# Patient Record
Sex: Male | Born: 1965 | Race: Black or African American | Hispanic: No | Marital: Single | Smoking: Current every day smoker
Health system: Southern US, Community
[De-identification: ages and names within clinical notes are randomized; demographics above are authoritative.]

## PROBLEM LIST (undated history)

## (undated) DIAGNOSIS — Z7289 Other problems related to lifestyle: Secondary | ICD-10-CM

---

## 1999-05-10 ENCOUNTER — Emergency Department (HOSPITAL_COMMUNITY): Admission: EM | Admit: 1999-05-10 | Discharge: 1999-05-11 | Payer: Self-pay | Admitting: Emergency Medicine

## 2001-11-07 ENCOUNTER — Inpatient Hospital Stay (HOSPITAL_COMMUNITY): Admission: EM | Admit: 2001-11-07 | Discharge: 2001-11-11 | Payer: Self-pay | Admitting: Psychiatry

## 2001-11-07 ENCOUNTER — Emergency Department (HOSPITAL_COMMUNITY): Admission: EM | Admit: 2001-11-07 | Discharge: 2001-11-07 | Payer: Self-pay | Admitting: Emergency Medicine

## 2018-05-21 ENCOUNTER — Emergency Department (HOSPITAL_COMMUNITY): Payer: Self-pay

## 2018-05-21 ENCOUNTER — Inpatient Hospital Stay (HOSPITAL_COMMUNITY)
Admission: EM | Admit: 2018-05-21 | Discharge: 2018-05-31 | DRG: 163 | Disposition: A | Discharge status: Home / Self Care | Payer: Self-pay | Attending: Cardiothoracic Surgery | Admitting: Cardiothoracic Surgery

## 2018-05-21 ENCOUNTER — Other Ambulatory Visit: Payer: Self-pay

## 2018-05-21 ENCOUNTER — Encounter (HOSPITAL_COMMUNITY): Payer: Self-pay | Admitting: Emergency Medicine

## 2018-05-21 DIAGNOSIS — Z9889 Other specified postprocedural states: Secondary | ICD-10-CM

## 2018-05-21 DIAGNOSIS — I10 Essential (primary) hypertension: Secondary | ICD-10-CM | POA: Diagnosis present

## 2018-05-21 DIAGNOSIS — E041 Nontoxic single thyroid nodule: Secondary | ICD-10-CM | POA: Diagnosis present

## 2018-05-21 DIAGNOSIS — J90 Pleural effusion, not elsewhere classified: Principal | ICD-10-CM

## 2018-05-21 DIAGNOSIS — J869 Pyothorax without fistula: Secondary | ICD-10-CM | POA: Diagnosis present

## 2018-05-21 DIAGNOSIS — Z720 Tobacco use: Secondary | ICD-10-CM | POA: Diagnosis present

## 2018-05-21 DIAGNOSIS — Z4682 Encounter for fitting and adjustment of non-vascular catheter: Secondary | ICD-10-CM

## 2018-05-21 DIAGNOSIS — F17210 Nicotine dependence, cigarettes, uncomplicated: Secondary | ICD-10-CM | POA: Diagnosis present

## 2018-05-21 DIAGNOSIS — J181 Lobar pneumonia, unspecified organism: Secondary | ICD-10-CM | POA: Diagnosis present

## 2018-05-21 LAB — BASIC METABOLIC PANEL
Anion gap: 9 (ref 5–15)
BUN: 11 mg/dL (ref 6–20)
CO2: 30 mmol/L (ref 22–32)
CREATININE: 0.78 mg/dL (ref 0.61–1.24)
Calcium: 9.5 mg/dL (ref 8.9–10.3)
Chloride: 99 mmol/L (ref 98–111)
GFR calc Af Amer: >60 mL/min (ref >60)
Glucose, Bld: 127 mg/dL — ABNORMAL HIGH (ref 70–99)
POTASSIUM: 4.4 mmol/L (ref 3.5–5.1)
Sodium: 138 mmol/L (ref 135–145)

## 2018-05-21 LAB — CBC
HEMATOCRIT: 47.9 % (ref 39.0–52.0)
Hemoglobin: 15.1 g/dL (ref 13.0–17.0)
MCH: 30.8 pg (ref 26.0–34.0)
MCHC: 31.5 g/dL (ref 30.0–36.0)
MCV: 97.6 fL (ref 78.0–100.0)
Platelets: 257 10*3/uL (ref 150–400)
RBC: 4.91 MIL/uL (ref 4.22–5.81)
RDW: 13.4 % (ref 11.5–15.5)
WBC: 9.3 10*3/uL (ref 4.0–10.5)

## 2018-05-21 LAB — I-STAT TROPONIN, ED
Troponin i, poc: 0 ng/mL (ref 0.00–0.08)
Troponin i, poc: 0 ng/mL (ref 0.00–0.08)

## 2018-05-21 MED ORDER — IOHEXOL 300 MG/ML  SOLN
100.0000 mL | Freq: Once | INTRAMUSCULAR | Status: AC | PRN
  Administered 2018-05-21: 100 mL via INTRAVENOUS

## 2018-05-21 MED ORDER — FENTANYL CITRATE (PF) 100 MCG/2ML IJ SOLN
50.0000 ug | Freq: Once | INTRAMUSCULAR | Status: AC
  Administered 2018-05-21: 50 ug via INTRAVENOUS
  Filled 2018-05-21: qty 2

## 2018-05-21 NOTE — ED Triage Notes (Signed)
Pt reports "side pain" but later elaborated that the pain has moved to his chest and in the back.  Pt denies n/v/d, weakness but indorses SOB.

## 2018-05-21 NOTE — ED Provider Notes (Signed)
Patient signed out to me at shift change.  Plan for admission for new left pleural effusion.  No fever, non-productive cough, no leukocytosis.    Appreciate Dr. Toniann FailKakrakandy for admitting.   Roxy HorsemanBrowning, Asier Desroches, PA-C 05/21/18 2245    Rolan BuccoBelfi, Melanie, MD 05/22/18 1153

## 2018-05-21 NOTE — ED Notes (Signed)
Pt stepped outside to smoke

## 2018-05-21 NOTE — ED Provider Notes (Signed)
[facility] Provider Note   CSN: [id number] Arrival date & time: 05/21/18  1458     History   Chief Complaint Chief Complaint  Patient presents with  . Chest Pain    HPI Amdrew Clancy GourdM Ozier is a 52 y.o. male.  Patient is a 52 year old male with no significant past medical history who presents with left-sided chest pain.  He states is been uncomfortable for about a week.  It hurts mostly when he takes a deep breath.  His left side and now goes around to his back and he also has some occasional tightness in the left anterior chest.  He denies any exertional symptoms.  He states he occasionally has some shortness of breath.  No leg pain or swelling.  He has what feels like a little tickle in his throat and he has been coughing a bit more due to that.  The cough is mostly nonproductive.  He denies any known fevers.  No history of lung problems in the past.  He does smoke cigarettes.     History reviewed. No pertinent past medical history.  There are no active problems to display for this patient.   History reviewed. No pertinent surgical history.      Home Medications    Prior to Admission medications   Not on File    Family History No family history on file.  Social History Social History   Tobacco Use  . Smoking status: Not on file  Substance Use Topics  . Alcohol use: Not on file  . Drug use: Not on file     Allergies   Patient has no known allergies.   Review of Systems Review of Systems  Constitutional: Positive for fatigue. Negative for chills, diaphoresis and fever.  HENT: Negative for congestion, rhinorrhea and sneezing.   Eyes: Negative.   Respiratory: Positive for shortness of breath. Negative for cough and chest tightness.   Cardiovascular: Positive for chest pain. Negative for leg swelling.  Gastrointestinal: Negative for abdominal pain, blood in stool, diarrhea, nausea and vomiting.  Genitourinary: Negative  for difficulty urinating, flank pain, frequency and hematuria.  Musculoskeletal: Negative for arthralgias and back pain.  Skin: Negative for rash.  Neurological: Negative for dizziness, speech difficulty, weakness, numbness and headaches.     Physical Exam Updated Vital Signs BP (!) 153/100   Pulse 84   Temp 98.8 F (37.1 C) (Oral)   Resp 19   Ht 5\' 7"  (1.702 m)   Wt 83.9 kg (185 lb)   SpO2 95%   BMI 28.98 kg/m   Physical Exam  Constitutional: He is oriented to person, place, and time. He appears well-developed and well-nourished.  HENT:  Head: Normocephalic and atraumatic.  Eyes: Pupils are equal, round, and reactive to light.  Neck: Normal range of motion. Neck supple.  Cardiovascular: Normal rate, regular rhythm and normal heart sounds.  Pulmonary/Chest: Effort normal and breath sounds normal. No respiratory distress. He has no wheezes. He has no rales. He exhibits no tenderness.  Abdominal: Soft. Bowel sounds are normal. There is no tenderness. There is no rebound and no guarding.  Musculoskeletal: Normal range of motion. He exhibits no edema.  No edema or calf tenderness  Lymphadenopathy:    He has no cervical adenopathy.  Neurological: He is alert and oriented to person, place, and time.  Skin: Skin is warm and dry. No rash noted.  Psychiatric: He has a normal mood and affect.     ED  Treatments / Results  Labs (all labs ordered are listed, but only abnormal results are displayed) Labs Reviewed  BASIC METABOLIC PANEL - Abnormal; Notable for the following components:      Result Value   Glucose, Bld 127 (*)    All other components within normal limits  CBC  I-STAT TROPONIN, ED  I-STAT TROPONIN, ED    EKG EKG Interpretation  Date/Time:  Saturday May 21 2018 15:17:09 EDT Ventricular Rate:  114 PR Interval:  134 QRS Duration: 84 QT Interval:  314 QTC Calculation: 432 R Axis:   50 Text Interpretation:  Sinus tachycardia Left ventricular hypertrophy  Nonspecific T wave abnormality Abnormal ECG No old tracing to compare Confirmed by Rolan Bucco [id number]) on 05/21/2018 8:11:03 PM   Radiology Dg Chest 2 View  Result Date: 05/21/2018 CLINICAL DATA:  Left side chest pain EXAM: CHEST - 2 VIEW COMPARISON:  None. FINDINGS: Large left pleural effusion with left base atelectasis. No confluent opacity on the right. Heart is mildly enlarged. No acute bony abnormality. IMPRESSION: Large left pleural effusion with left base atelectasis. Electronically Signed   By: Charlett Nose M.D.   On: 05/21/2018 16:07   Ct Chest W Contrast  Result Date: 05/21/2018 CLINICAL DATA:  Pleural effusion.  Chest pain. EXAM: CT CHEST WITH CONTRAST TECHNIQUE: Multidetector CT imaging of the chest was performed during intravenous contrast administration. CONTRAST:  OMNIPAQUE IOHEXOL 300 MG/ML  SOLN COMPARISON:  Chest x-ray earlier today FINDINGS: Cardiovascular: Heart is normal size.  Aorta is normal caliber. Mediastinum/Nodes: Scattered borderline and small mediastinal lymph nodes, none pathologically enlarged. No mediastinal, hilar, or axillary adenopathy. Lungs/Pleura: Moderate to large left pleural effusion. Compressive atelectasis in the left lower lobe and lingula. Minimal right base atelectasis. Upper Abdomen: Imaging into the upper abdomen shows no acute findings. Musculoskeletal: Chest wall soft tissues are unremarkable. No acute bony abnormality. IMPRESSION: Moderate to large left pleural effusion with left lower lobe and lingular atelectasis. 3.2 cm left thyroid nodule. This could be further characterized with elective thyroid ultrasound. Electronically Signed   By: Charlett Nose M.D.   On: 05/21/2018 21:22    Procedures Procedures (including critical care time)  Medications Ordered in ED Medications  fentaNYL (SUBLIMAZE) injection 50 mcg (50 mcg Intravenous Given 05/21/18 2037)  iohexol (OMNIPAQUE) 300 MG/ML solution 100 mL (100 mLs Intravenous Contrast Given  05/21/18 2054)     Initial Impression / Assessment and Plan / ED Course  I have reviewed the triage vital signs and the nursing notes.  Pertinent labs & imaging results that were available during my care of the patient were reviewed by me and considered in my medical decision making (see chart for details).     Patient is a 52 year old male who presents with left side chest pain.  To little worse when he takes a deep breath.  He has some mild associated shortness of breath, primarily on ambulation.  He is not hypoxic at rest.  His chest x-ray showed a moderate to large sized pleural effusion.  CT scan with contrast shows no underlying mass or consolidation but there is a moderate sized effusion.  I feel he needs to be admitted for further evaluation of this pleural effusion.  Ivar Drape to discuss with hospitalist.  Pt[facility] at [facility].  Final Clinical Impressions(s) / ED Diagnoses   Final diagnoses:  Pleural effusion    ED Discharge Orders    None       Rolan Bucco, MD 05/21/18 2221

## 2018-05-21 NOTE — ED Notes (Signed)
Called in wt room with no answer 

## 2018-05-21 NOTE — ED Notes (Signed)
Patient transported to CT 

## 2018-05-22 ENCOUNTER — Observation Stay (HOSPITAL_COMMUNITY): Payer: Self-pay

## 2018-05-22 ENCOUNTER — Encounter (HOSPITAL_COMMUNITY): Payer: Self-pay | Admitting: Internal Medicine

## 2018-05-22 ENCOUNTER — Other Ambulatory Visit (HOSPITAL_COMMUNITY): Payer: Self-pay

## 2018-05-22 DIAGNOSIS — Z720 Tobacco use: Secondary | ICD-10-CM | POA: Diagnosis present

## 2018-05-22 LAB — HEPATIC FUNCTION PANEL
ALT: 22 U/L (ref 0–44)
AST: 20 U/L (ref 15–41)
Albumin: 3.1 g/dL — ABNORMAL LOW (ref 3.5–5.0)
Alkaline Phosphatase: 46 U/L (ref 38–126)
BILIRUBIN INDIRECT: 0.5 mg/dL (ref 0.3–0.9)
Bilirubin, Direct: 0.2 mg/dL (ref 0.0–0.2)
TOTAL PROTEIN: 6.7 g/dL (ref 6.5–8.1)
Total Bilirubin: 0.7 mg/dL (ref 0.3–1.2)

## 2018-05-22 LAB — BODY FLUID CELL COUNT WITH DIFFERENTIAL
Eos, Fluid: 0 %
Lymphs, Fluid: 5 %
Monocyte-Macrophage-Serous Fluid: 14 % — ABNORMAL LOW (ref 50–90)
Neutrophil Count, Fluid: 81 % — ABNORMAL HIGH (ref 0–25)
Total Nucleated Cell Count, Fluid: 10000 cu mm — ABNORMAL HIGH (ref 0–1000)

## 2018-05-22 LAB — GRAM STAIN

## 2018-05-22 LAB — ALBUMIN, PLEURAL OR PERITONEAL FLUID: Albumin, Fluid: 2.7 g/dL

## 2018-05-22 LAB — LACTATE DEHYDROGENASE: LDH: 122 U/L (ref 98–192)

## 2018-05-22 LAB — BASIC METABOLIC PANEL
Anion gap: 11 (ref 5–15)
BUN: 7 mg/dL (ref 6–20)
CO2: 27 mmol/L (ref 22–32)
Calcium: 9 mg/dL (ref 8.9–10.3)
Chloride: 98 mmol/L (ref 98–111)
Creatinine, Ser: 0.64 mg/dL (ref 0.61–1.24)
GFR calc Af Amer: >60 mL/min (ref >60)
GFR calc non Af Amer: >60 mL/min (ref >60)
Glucose, Bld: 139 mg/dL — ABNORMAL HIGH (ref 70–99)
Potassium: 3.9 mmol/L (ref 3.5–5.1)
SODIUM: 136 mmol/L (ref 135–145)

## 2018-05-22 LAB — CBC
HCT: 42.9 % (ref 39.0–52.0)
HEMOGLOBIN: 13.9 g/dL (ref 13.0–17.0)
MCH: 30.9 pg (ref 26.0–34.0)
MCHC: 32.4 g/dL (ref 30.0–36.0)
MCV: 95.3 fL (ref 78.0–100.0)
Platelets: 219 10*3/uL (ref 150–400)
RBC: 4.5 MIL/uL (ref 4.22–5.81)
RDW: 13.4 % (ref 11.5–15.5)
WBC: 8.9 10*3/uL (ref 4.0–10.5)

## 2018-05-22 LAB — TROPONIN I
Troponin I: <0.03 ng/mL (ref <0.03)
Troponin I: <0.03 ng/mL (ref <0.03)

## 2018-05-22 LAB — AMYLASE, PLEURAL OR PERITONEAL FLUID: Amylase, Fluid: 77 U/L

## 2018-05-22 LAB — PROTEIN, PLEURAL OR PERITONEAL FLUID: Total protein, fluid: 5.1 g/dL

## 2018-05-22 LAB — LACTATE DEHYDROGENASE, PLEURAL OR PERITONEAL FLUID: LD FL: 524 U/L — AB (ref 3–23)

## 2018-05-22 LAB — PROCALCITONIN

## 2018-05-22 LAB — TSH: TSH: 0.946 u[IU]/mL (ref 0.350–4.500)

## 2018-05-22 LAB — HIV ANTIBODY (ROUTINE TESTING W REFLEX): HIV Screen 4th Generation wRfx: NONREACTIVE

## 2018-05-22 LAB — GLUCOSE, PLEURAL OR PERITONEAL FLUID: GLUCOSE FL: 74 mg/dL

## 2018-05-22 MED ORDER — LIDOCAINE HCL (PF) 1 % IJ SOLN
INTRAMUSCULAR | Status: AC
  Filled 2018-05-22: qty 30

## 2018-05-22 MED ORDER — ONDANSETRON HCL 4 MG PO TABS: 4.0000 mg | ORAL_TABLET | Freq: Four times a day (QID) | ORAL | Status: DC | PRN

## 2018-05-22 MED ORDER — LEVOFLOXACIN 750 MG PO TABS
750.0000 mg | ORAL_TABLET | Freq: Every day | ORAL | Status: DC
  Administered 2018-05-22 – 2018-05-25 (×4): 750 mg via ORAL
  Filled 2018-05-22 (×4): qty 1

## 2018-05-22 MED ORDER — OXYCODONE HCL 5 MG PO TABS
5.0000 mg | ORAL_TABLET | ORAL | Status: DC | PRN
  Administered 2018-05-22 (×2): 5 mg via ORAL
  Filled 2018-05-22 (×2): qty 1

## 2018-05-22 MED ORDER — ONDANSETRON HCL 4 MG/2ML IJ SOLN: 4.0000 mg | Freq: Four times a day (QID) | INTRAMUSCULAR | Status: DC | PRN

## 2018-05-22 MED ORDER — ACETAMINOPHEN 325 MG PO TABS
650.0000 mg | ORAL_TABLET | Freq: Four times a day (QID) | ORAL | Status: DC | PRN
  Administered 2018-05-22 – 2018-05-26 (×7): 650 mg via ORAL
  Filled 2018-05-22 (×7): qty 2

## 2018-05-22 MED ORDER — ACETAMINOPHEN 650 MG RE SUPP: 650.0000 mg | Freq: Four times a day (QID) | RECTAL | Status: DC | PRN

## 2018-05-22 NOTE — Procedures (Signed)
PROCEDURE SUMMARY:  Successful US guided left thoracentesis. Yielded 580 mL of tea colored fluid. Patient tolerated procedure well. No immediate complications.  Specimen was sent for labs.  Post procedure chest X-ray reveals no pneumothorax  Matthewjames Petrasek S Emine Lopata PA-C 05/22/2018 12:13 PM

## 2018-05-22 NOTE — H&P (Addendum)
History and Physical    John Rodriguez WUJ:[id number] DOB: [birth year] DOA: 05/21/2018  PCP: Lavinia Sharps, NP  Patient coming from: Home.  Chief Complaint: Chest pain.  HPI: John Rodriguez is a 52 y.o. male with history of tobacco abuse presents to the ER with complaint of chest pain.  Patient states over the last 1 week he has been having chest pain which started on the left side of the anterior chest wall which gradually moved across the chest and is present all over the chest at this time.  Has no specific exertional symptoms or any aggravating or relieving factors.  Denies any productive cough fever or chills denies any recent travel or sick contacts.  Denies any loss of weight.  ED Course: In the ER EKG shows sinus tachycardia with LVH and troponin was negative.  CT chest with contrast shows large left pleural effusion.  Patient admitted for thoracentesis and symptomatic management and further evaluation of the left pleural effusion.  Review of Systems: As per HPI, rest all negative.   History reviewed. No pertinent past medical history.  History reviewed. No pertinent surgical history.   reports that he has been smoking.  He has never used smokeless tobacco. He reports that he drinks alcohol. His drug history is not on file.  No Known Allergies  Family History  Problem Relation Age of Onset  . Prostate cancer Father     Prior to Admission medications   Medication Sig Start Date End Date Taking? Authorizing Provider  naproxen sodium (ALEVE) 220 MG tablet Take 220 mg by mouth 2 (two) times daily as needed (for pain).   Yes [provider]    Physical Exam: Vitals:   05/21/18 2015 05/21/18 2115 05/21/18 2130 05/22/18 0000  BP: (!) 158/94 (!) 152/98 (!) 153/100 (!) 133/93  Pulse: 91 88 84 98  Resp: (!) 21 18 19    Temp:      TempSrc:      SpO2: 95% 93% 95% 95%  Weight:      Height:          Constitutional: Moderately built and nourished. Vitals:   05/21/18 2015 05/21/18 2115 05/21/18 2130 05/22/18 0000  BP: (!) 158/94 (!) 152/98 (!) 153/100 (!) 133/93  Pulse: 91 88 84 98  Resp: (!) 21 18 19    Temp:      TempSrc:      SpO2: 95% 93% 95% 95%  Weight:      Height:       Eyes: Anicteric no pallor. ENMT: No discharge from the ears eyes nose or mouth. Neck: No mass or.  No neck rigidity.  No JVD appreciated. Respiratory: No rhonchi or crepitations. Cardiovascular: S1-S2 heard no murmurs appreciated. Abdomen: Soft nontender bowel sounds present. Musculoskeletal: No edema.  No joint effusion. Skin: No rash.  Skin appears warm. Neurologic: Alert awake oriented to time place and person.  Moves all extremities. Psychiatric: Appears normal.  Normal affect.   Labs on Admission: I have personally reviewed following labs and imaging studies  CBC: Recent Labs  Lab 05/21/18 1532  WBC 9.3  HGB 15.1  HCT 47.9  MCV 97.6  PLT 257   Basic Metabolic Panel: Recent Labs  Lab 05/21/18 1532  NA 138  K 4.4  CL 99  CO2 30  GLUCOSE 127*  BUN 11  CREATININE 0.78  CALCIUM 9.5   GFR: Estimated Creatinine Clearance: 111.8 mL/min (by C-G formula based on SCr of 0.78 mg/dL). Liver Function  Tests: No results for input(s): AST, ALT, ALKPHOS, BILITOT, PROT, ALBUMIN in the last 168 hours. No results for input(s): LIPASE, AMYLASE in the last 168 hours. No results for input(s): AMMONIA in the last 168 hours. Coagulation Profile: No results for input(s): INR, PROTIME in the last 168 hours. Cardiac Enzymes: No results for input(s): CKTOTAL, CKMB, CKMBINDEX, TROPONINI in the last 168 hours. BNP (last 3 results) No results for input(s): PROBNP in the last 8760 hours. HbA1C: No results for input(s): HGBA1C in the last 72 hours. CBG: No results for input(s): GLUCAP in the last 168 hours. Lipid Profile: No results for input(s): CHOL, HDL, LDLCALC, TRIG, CHOLHDL, LDLDIRECT in the last 72 hours. Thyroid Function Tests: No results for input(s):  TSH, T4TOTAL, FREET4, T3FREE, THYROIDAB in the last 72 hours. Anemia Panel: No results for input(s): VITAMINB12, FOLATE, FERRITIN, TIBC, IRON, RETICCTPCT in the last 72 hours. Urine analysis: No results found for: COLORURINE, APPEARANCEUR, LABSPEC, PHURINE, GLUCOSEU, HGBUR, BILIRUBINUR, KETONESUR, PROTEINUR, UROBILINOGEN, NITRITE, LEUKOCYTESUR Sepsis Labs: @LABRCNTIP (procalcitonin:4,lacticidven:4) )No results found for this or any previous visit (from the past 240 hour(s)).   Radiological Exams on Admission: Dg Chest 2 View  Result Date: 05/21/2018 CLINICAL DATA:  Left side chest pain EXAM: CHEST - 2 VIEW COMPARISON:  None. FINDINGS: Large left pleural effusion with left base atelectasis. No confluent opacity on the right. Heart is mildly enlarged. No acute bony abnormality. IMPRESSION: Large left pleural effusion with left base atelectasis. Electronically Signed   By: Charlett NoseKevin  Dover M.D.   On: 05/21/2018 16:07   Ct Chest W Contrast  Result Date: 05/21/2018 CLINICAL DATA:  Pleural effusion.  Chest pain. EXAM: CT CHEST WITH CONTRAST TECHNIQUE: Multidetector CT imaging of the chest was performed during intravenous contrast administration. CONTRAST:  100mL OMNIPAQUE IOHEXOL 300 MG/ML  SOLN COMPARISON:  Chest x-ray earlier today FINDINGS: Cardiovascular: Heart is normal size.  Aorta is normal caliber. Mediastinum/Nodes: Scattered borderline and small mediastinal lymph nodes, none pathologically enlarged. No mediastinal, hilar, or axillary adenopathy. Lungs/Pleura: Moderate to large left pleural effusion. Compressive atelectasis in the left lower lobe and lingula. Minimal right base atelectasis. Upper Abdomen: Imaging into the upper abdomen shows no acute findings. Musculoskeletal: Chest wall soft tissues are unremarkable. No acute bony abnormality. IMPRESSION: Moderate to large left pleural effusion with left lower lobe and lingular atelectasis. 3.2 cm left thyroid nodule. This could be further  characterized with elective thyroid ultrasound. Electronically Signed   By: Charlett NoseKevin  Dover M.D.   On: 05/21/2018 21:22    EKG: Independently reviewed.  Sinus tachycardia with LVH.  Assessment/Plan Principal Problem:   Pleural effusion Active Problems:   Tobacco abuse    1. Left pleural effusion -we will order ultrasound-guided thoracentesis in the morning.  Will check studies including cytology cell count differential Gram stain.  Patient does not have any fever or chills at this time and no definite signs of any infections. 2. Tobacco abuse -advised to quit smoking. 3. Elevated blood pressure reading -closely follow blood pressure trends. 4. Thyroid nodule will need thyroid ultrasound.  Will check TSH. 5. Left-sided chest pain likely from pleural effusion.  We will cycle cardiac markers.   DVT prophylaxis: SCDs in anticipation of thoracentesis. Code Status: Full code. Family Communication: Discussed with patient. Disposition Plan: Home. Consults called: None. Admission status: Observation.   Eduard ClosArshad N Amylee Lodato MD [facility] Pager [phone].  If 7PM-7AM, please contact night-coverage [email or web address] Password [email or web address]  05/22/2018, 1:07 AM

## 2018-05-22 NOTE — Progress Notes (Signed)
[facility] PROGRESS NOTE  John Clancy GourdM Knoche ZOX:[id number] DOB: 1966-04-17 DOA: 05/21/2018  PCP: Lavinia SharpsPlacey, Mary Ann, NP  Brief History/Interval Summary: 52 year old African-American male with a past medical history of tobacco abuse and no other significant health issues presented one 1 week history of left-sided chest and back pain and some shortness of breath.  Denies any fever or chills.  No weight loss.  No sick contacts recently.  No travel recently.  He was found to have sinus tachycardia with evidence for LVH on EKG.  CT scan shows a large left-sided pleural effusion.  Patient also found to have a left thyroid nodule.  Reason for Visit: Large left pleural effusion.  Left thyroid nodule.  Consultants: Interventional radiology  Procedures: None yet  Antibiotics: None  Subjective/Interval History: Patient continues to have pain on the left side of his chest and back.  Denies any other complaints otherwise.  No nausea vomiting.  ROS: Denies any headaches  Objective:  Vital Signs  Vitals:   05/22/18 0812 05/22/18 0945 05/22/18 0959 05/22/18 1000  BP: (!) 148/96 132/86 (!) 144/110 (!) 140/99  Pulse: 92     Resp: 20     Temp: 98.4 F (36.9 C)     TempSrc: Oral     SpO2: 93%     Weight:      Height:        Intake/Output Summary (Last 24 hours) at 05/22/2018 1134 Last data filed at 05/22/2018 0505 Gross per 24 hour  Intake -  Output 150 ml  Net -150 ml   Filed Weights   05/21/18 1525  Weight: 83.9 kg (185 lb)    General appearance: alert, cooperative, appears stated age and no distress Head: Normocephalic, without obvious abnormality, atraumatic Resp: Mildly tachypneic at rest.  Dullness to percussion on the left side.  Very limited air entry noted.  No definite crackles or wheezing. Cardio: regular rate and rhythm, S1, S2 normal, no murmur, click, rub or gallop GI: soft, non-tender; bowel sounds normal; no masses,  no organomegaly Extremities: extremities  normal, atraumatic, no cyanosis or edema Pulses: 2+ and symmetric Neurologic: Alert and oriented x3.  No focal neurological deficits.  Lab Results:  Data Reviewed: I have personally reviewed following labs and imaging studies  CBC: Recent Labs  Lab 05/21/18 1532 05/22/18 0148  WBC 9.3 8.9  HGB 15.1 13.9  HCT 47.9 42.9  MCV 97.6 95.3  PLT 257 219    Basic Metabolic Panel: Recent Labs  Lab 05/21/18 1532 05/22/18 0148  NA 138 136  K 4.4 3.9  CL 99 98  CO2 30 27  GLUCOSE 127* 139*  BUN 11 7  CREATININE 0.78 0.64  CALCIUM 9.5 9.0    GFR: Estimated Creatinine Clearance: 111.8 mL/min (by C-G formula based on SCr of 0.64 mg/dL).  Liver Function Tests: Recent Labs  Lab 05/22/18 0148  AST 20  ALT 22  ALKPHOS 46  BILITOT 0.7  PROT 6.7  ALBUMIN 3.1*     Cardiac Enzymes: Recent Labs  Lab 05/22/18 0148 05/22/18 0816  TROPONINI <0.03 <0.03    Thyroid Function Tests: Recent Labs    05/22/18 0148  TSH 0.946     Radiology Studies: Dg Chest 1 View  Result Date: 05/22/2018 CLINICAL DATA:  Status post left thoracentesis EXAM: CHEST  1 VIEW COMPARISON:  05/21/2018 FINDINGS: Cardiac shadow is stable. Left pleural effusion is decreased slightly in the interval from the prior exam following thoracentesis. No pneumothorax is noted. Persistent infiltrate in  the left base and effusion is seen. Mild atelectatic changes in the right base are noted. IMPRESSION: Slight decrease in left pleural effusion. Left basilar infiltrate is present and stable. No pneumothorax is noted. Electronically Signed   By: Alcide Clever M.D.   On: 05/22/2018 10:33   Dg Chest 2 View  Result Date: 05/21/2018 CLINICAL DATA:  Left side chest pain EXAM: CHEST - 2 VIEW COMPARISON:  None. FINDINGS: Large left pleural effusion with left base atelectasis. No confluent opacity on the right. Heart is mildly enlarged. No acute bony abnormality. IMPRESSION: Large left pleural effusion with left base  atelectasis. Electronically Signed   By: Charlett Nose M.D.   On: 05/21/2018 16:07   Ct Chest W Contrast  Result Date: 05/21/2018 CLINICAL DATA:  Pleural effusion.  Chest pain. EXAM: CT CHEST WITH CONTRAST TECHNIQUE: Multidetector CT imaging of the chest was performed during intravenous contrast administration. CONTRAST:  OMNIPAQUE IOHEXOL 300 MG/ML  SOLN COMPARISON:  Chest x-ray earlier today FINDINGS: Cardiovascular: Heart is normal size.  Aorta is normal caliber. Mediastinum/Nodes: Scattered borderline and small mediastinal lymph nodes, none pathologically enlarged. No mediastinal, hilar, or axillary adenopathy. Lungs/Pleura: Moderate to large left pleural effusion. Compressive atelectasis in the left lower lobe and lingula. Minimal right base atelectasis. Upper Abdomen: Imaging into the upper abdomen shows no acute findings. Musculoskeletal: Chest wall soft tissues are unremarkable. No acute bony abnormality. IMPRESSION: Moderate to large left pleural effusion with left lower lobe and lingular atelectasis. 3.2 cm left thyroid nodule. This could be further characterized with elective thyroid ultrasound. Electronically Signed   By: Charlett Nose M.D.   On: 05/21/2018 21:22   US Thyroid  Result Date: 05/22/2018 CLINICAL DATA:  Incidental on CT. Left thyroid nodule detected by CT. EXAM: THYROID ULTRASOUND TECHNIQUE: Ultrasound examination of the thyroid gland and adjacent soft tissues was performed. COMPARISON:  CT of the chest on 05/21/2018 FINDINGS: Parenchymal Echotexture: Normal Isthmus: 0.5 cm Right lobe: 5.8 x 1.9 x 2.1 cm Left lobe: 6.3 x 3.1 x 3.1 cm _________________________________________________________ Estimated total number of nodules >/= 1 cm: 1 Number of spongiform nodules >/=  2 cm not described below (TR1): 0 Number of mixed cystic and solid nodules >/= 1.5 cm not described below (TR2): 0 _________________________________________________________ Nodule # 1: Location: Left; Inferior  Maximum size: 3.7 cm; Other 2 dimensions: 2.4 x 2.8 cm Composition: solid/almost completely solid (2) Echogenicity: isoechoic (1) Shape: not taller-than-wide (0) Margins: smooth (0) Echogenic foci: none (0) ACR TI-RADS total points: 3. ACR TI-RADS risk category: TR3 (3 points). ACR TI-RADS recommendations: **Given size (>/= 2.5 cm) and appearance, fine needle aspiration of this mildly suspicious nodule should be considered based on TI-RADS criteria. _________________________________________________________ No enlarged lymph nodes identified. IMPRESSION: Solitary left inferior thyroid nodule measuring 3.7 x 2.4 x 2.8 cm. This nodule meets TI-RADS criteria for elective fine-needle aspiration. The above is in keeping with the ACR TI-RADS recommendations - J Am Coll Radiol 2017;14:587-595. Electronically Signed   By: Irish Lack M.D.   On: 05/22/2018 08:28     Medications:  Scheduled: . lidocaine (PF)       Continuous:  ZOX:[id number] **OR** acetaminophen, ondansetron **OR** ondansetron (ZOFRAN) IV  Assessment/Plan:    Left-sided pleural effusion This was noted to be large on imaging studies.  Etiology is unclear.  Patient does not have any fever.  Has a normal WBC.  Unlikely to be infectious process.  Patient however does smoke and has been smoking for 30+ years.  Denies  any weight loss though.  Patient will benefit from further work-up including diagnostic and therapeutic thoracentesis.  Check LDH.  Letter fluid analysis to be done once thoracentesis is been accomplished.  We will also send fluid for cytology and AFB.  Left thyroid nodule TSH is normal.  Ultrasound of the thyroid does show a nodule and fine-needle aspiration biopsy is recommended.  Patient does not have a primary care provider.  No means for outpatient follow-up.  We will request IR to see if they can do it while he is in the hospital.  Left-sided chest pain and back pain Most likely secondary to the pleural effusion.   EKG showed LVH.  No obvious ischemic changes.  Troponin was normal.  Mild cardiomegaly noted on chest x-ray.  Proceed with echocardiogram.  Elevated blood pressure No diagnosis of hypertension but it is quite possible he has hypertension considering LVH noted on EKG.  Continue to monitor for now.  He will likely need definitive treatment.  DVT Prophylaxis: SCDs    Code Status: Full code Family Communication: Discussed with the patient Disposition Plan: Await thoracentesis and thyroid biopsy and echocardiogram.  Patient lives with his uncle.    LOS: 0 days   Osvaldo Shipper  [facility] Pager [phone] 05/22/2018, 11:34 AM  If 7PM-7AM, please contact night-coverage at [email or web address], password [facility]

## 2018-05-23 ENCOUNTER — Observation Stay (HOSPITAL_BASED_OUTPATIENT_CLINIC_OR_DEPARTMENT_OTHER): Payer: Self-pay

## 2018-05-23 DIAGNOSIS — I517 Cardiomegaly: Secondary | ICD-10-CM

## 2018-05-23 DIAGNOSIS — R030 Elevated blood-pressure reading, without diagnosis of hypertension: Secondary | ICD-10-CM

## 2018-05-23 LAB — ACID FAST SMEAR (AFB, MYCOBACTERIA): Acid Fast Smear: NEGATIVE

## 2018-05-23 LAB — PROCALCITONIN: Procalcitonin: <0.1 ng/mL

## 2018-05-23 LAB — TRIGLYCERIDES, BODY FLUIDS: Triglycerides, Fluid: 22 mg/dL

## 2018-05-23 LAB — ECHOCARDIOGRAM COMPLETE
HEIGHTINCHES: 67 in
WEIGHTICAEL: 2960 [oz_av]

## 2018-05-23 NOTE — Care Management Note (Addendum)
Case Management Note  Patient Details  Name: John Rodriguez MRN: [id number] Date of Birth: [birth year]  Subjective/Objective:  From home with uncle, presents with Large Left Pl effusion, left thyroid nodule, await thoracentesis and thyroid bx and echo per MD note.  [name], [facility].    8/2 Letha Capeeborah Markee Matera RN, BSN -Plan left VATS for drainage of empyema and decortication of lung August 2.                    Action/Plan: NCM will follow for transition of care.   Expected Discharge Date:                  Expected Discharge Plan:  Home/Self Care  In-House Referral:     Discharge planning Services  CM Consult  Post Acute Care Choice:    Choice offered to:     DME Arranged:    DME Agency:     HH Arranged:    HH Agency:     Status of Service:  In process, will continue to follow  If discussed at Long Length of Stay Meetings, dates discussed:    Additional Comments:  Leone Havenaylor, Emric Kowalewski [place], RN 05/23/2018, 9:59 AM

## 2018-05-23 NOTE — Progress Notes (Signed)
Echocardiogram 2D Echocardiogram has been performed.  05/23/2018 12:01 PM Gertie FeyMichelle Alishea Beaudin, BS, RVT, RDCS, RDMS

## 2018-05-23 NOTE — Plan of Care (Signed)
Discussed plan of care with patient.  Emphasized the importance of using the call bell when assistance is needed.  Also discussed pain management.  Good teach back displayed.

## 2018-05-23 NOTE — Progress Notes (Signed)
[facility] PROGRESS NOTE  Cataldo Clancy GourdM Muradyan ZOX:[id number] DOB: 18-Sep-1966 DOA: 05/21/2018  PCP: Lavinia SharpsPlacey, Mary Ann, NP  Brief History/Interval Summary: 52 year old African-American male with a past medical history of tobacco abuse and no other significant health issues presented one 1 week history of left-sided chest and back pain and some shortness of breath.  Denies any fever or chills.  No weight loss.  No sick contacts recently.  No travel recently.  He was found to have sinus tachycardia with evidence for LVH on EKG.  CT scan shows a large left-sided pleural effusion.  Patient also found to have a left thyroid nodule.  Reason for Visit: Large left pleural effusion.  Left thyroid nodule.  Consultants: Interventional radiology.  Phone discussion with pulmonology  Procedures:  Thoracentesis on 7/28  Antibiotics: None  Subjective/Interval History: Patient states that he is not significantly improved compared to yesterday.  Still has some difficulty breathing.  Still has discomfort in the left side of his chest and back.    ROS: Denies any nausea or vomiting  Objective:  Vital Signs  Vitals:   05/22/18 1000 05/22/18 1620 05/22/18 2300 05/23/18 0734  BP: (!) 140/99 (!) 145/90 (!) 128/91 (!) 152/97  Pulse:  76 81 87  Resp:  20 18 19   Temp:  98.8 F (37.1 C) 99.5 F (37.5 C) 99.2 F (37.3 C)  TempSrc:  Oral Oral Oral  SpO2:  98% 96% 91%  Weight:      Height:        Intake/Output Summary (Last 24 hours) at 05/23/2018 0853 Last data filed at 05/23/2018 0500 Gross per 24 hour  Intake 480 ml  Output 2 ml  Net 478 ml   Filed Weights   05/21/18 1525  Weight: 83.9 kg (185 lb)    General appearance: Awake alert.  In no distress Resp: Normal effort at rest today.  Dullness to percussion on left side persists.  Slightly improved air entry on the left.  Few crackles.  No wheezing or rhonchi.   Cardio: S1-S2 is normal regular.  No S3-S4.  No rubs murmurs or bruit GI:  Abdomen is soft.  Nontender nondistended.  Bowel sounds are present.  No masses organomegaly Neurologic: Alert and oriented x3.  No focal neurological deficits  Lab Results:  Data Reviewed: I have personally reviewed following labs and imaging studies  CBC: Recent Labs  Lab 05/21/18 1532 05/22/18 0148  WBC 9.3 8.9  HGB 15.1 13.9  HCT 47.9 42.9  MCV 97.6 95.3  PLT 257 219    Basic Metabolic Panel: Recent Labs  Lab 05/21/18 1532 05/22/18 0148  NA 138 136  K 4.4 3.9  CL 99 98  CO2 30 27  GLUCOSE 127* 139*  BUN 11 7  CREATININE 0.78 0.64  CALCIUM 9.5 9.0    GFR: Estimated Creatinine Clearance: 111.8 mL/min (by C-G formula based on SCr of 0.64 mg/dL).  Liver Function Tests: Recent Labs  Lab 05/22/18 0148  AST 20  ALT 22  ALKPHOS 46  BILITOT 0.7  PROT 6.7  ALBUMIN 3.1*     Cardiac Enzymes: Recent Labs  Lab 05/22/18 0148 05/22/18 0816 05/22/18 1449  TROPONINI <0.03 <0.03 <0.03    Thyroid Function Tests: Recent Labs    05/22/18 0148  TSH 0.946     Radiology Studies: Dg Chest 1 View  Result Date: 05/22/2018 CLINICAL DATA:  Status post left thoracentesis EXAM: CHEST  1 VIEW COMPARISON:  05/21/2018 FINDINGS: Cardiac shadow is stable. Left pleural  effusion is decreased slightly in the interval from the prior exam following thoracentesis. No pneumothorax is noted. Persistent infiltrate in the left base and effusion is seen. Mild atelectatic changes in the right base are noted. IMPRESSION: Slight decrease in left pleural effusion. Left basilar infiltrate is present and stable. No pneumothorax is noted. Electronically Signed   By: Alcide Clever M.D.   On: 05/22/2018 10:33   Dg Chest 2 View  Result Date: 05/21/2018 CLINICAL DATA:  Left side chest pain EXAM: CHEST - 2 VIEW COMPARISON:  None. FINDINGS: Large left pleural effusion with left base atelectasis. No confluent opacity on the right. Heart is mildly enlarged. No acute bony abnormality. IMPRESSION: Large  left pleural effusion with left base atelectasis. Electronically Signed   By: Charlett Nose M.D.   On: 05/21/2018 16:07   Ct Chest W Contrast  Result Date: 05/21/2018 CLINICAL DATA:  Pleural effusion.  Chest pain. EXAM: CT CHEST WITH CONTRAST TECHNIQUE: Multidetector CT imaging of the chest was performed during intravenous contrast administration. CONTRAST:  OMNIPAQUE IOHEXOL 300 MG/ML  SOLN COMPARISON:  Chest x-ray earlier today FINDINGS: Cardiovascular: Heart is normal size.  Aorta is normal caliber. Mediastinum/Nodes: Scattered borderline and small mediastinal lymph nodes, none pathologically enlarged. No mediastinal, hilar, or axillary adenopathy. Lungs/Pleura: Moderate to large left pleural effusion. Compressive atelectasis in the left lower lobe and lingula. Minimal right base atelectasis. Upper Abdomen: Imaging into the upper abdomen shows no acute findings. Musculoskeletal: Chest wall soft tissues are unremarkable. No acute bony abnormality. IMPRESSION: Moderate to large left pleural effusion with left lower lobe and lingular atelectasis. 3.2 cm left thyroid nodule. This could be further characterized with elective thyroid ultrasound. Electronically Signed   By: Charlett Nose M.D.   On: 05/21/2018 21:22   US Thyroid  Result Date: 05/22/2018 CLINICAL DATA:  Incidental on CT. Left thyroid nodule detected by CT. EXAM: THYROID ULTRASOUND TECHNIQUE: Ultrasound examination of the thyroid gland and adjacent soft tissues was performed. COMPARISON:  CT of the chest on 05/21/2018 FINDINGS: Parenchymal Echotexture: Normal Isthmus: 0.5 cm Right lobe: 5.8 x 1.9 x 2.1 cm Left lobe: 6.3 x 3.1 x 3.1 cm _________________________________________________________ Estimated total number of nodules >/= 1 cm: 1 Number of spongiform nodules >/=  2 cm not described below (TR1): 0 Number of mixed cystic and solid nodules >/= 1.5 cm not described below (TR2): 0 _________________________________________________________  Nodule # 1: Location: Left; Inferior Maximum size: 3.7 cm; Other 2 dimensions: 2.4 x 2.8 cm Composition: solid/almost completely solid (2) Echogenicity: isoechoic (1) Shape: not taller-than-wide (0) Margins: smooth (0) Echogenic foci: none (0) ACR TI-RADS total points: 3. ACR TI-RADS risk category: TR3 (3 points). ACR TI-RADS recommendations: **Given size (>/= 2.5 cm) and appearance, fine needle aspiration of this mildly suspicious nodule should be considered based on TI-RADS criteria. _________________________________________________________ No enlarged lymph nodes identified. IMPRESSION: Solitary left inferior thyroid nodule measuring 3.7 x 2.4 x 2.8 cm. This nodule meets TI-RADS criteria for elective fine-needle aspiration. The above is in keeping with the ACR TI-RADS recommendations - J Am Coll Radiol 2017;14:587-595. Electronically Signed   By: Irish Lack M.D.   On: 05/22/2018 08:28   US Thoracentesis Asp Pleural Space W/img Guide  Result Date: 05/22/2018 INDICATION: Shortness of breath. No other medical history. Large left pleural effusion. Request for diagnostic and therapeutic thoracentesis. EXAM: ULTRASOUND GUIDED LEFT THORACENTESIS MEDICATIONS: 1% lidocaine 10 mL COMPLICATIONS: None immediate. PROCEDURE: An ultrasound guided thoracentesis was thoroughly discussed with the patient and questions answered. The  benefits, risks, alternatives and complications were also discussed. The patient understands and wishes to proceed with the procedure. Written consent was obtained. Ultrasound was performed to localize and mark an adequate pocket of fluid in the left chest. The area was then prepped and draped in the normal sterile fashion. 1% Lidocaine was used for local anesthesia. Under ultrasound guidance a 6 Fr Safe-T-Centesis catheter was introduced. Thoracentesis was performed. The catheter was removed and a dressing applied. FINDINGS: A total of approximately 580 mL of tea-colored fluid was removed.  Samples were sent to the laboratory as requested by the clinical team. IMPRESSION: Successful ultrasound guided left thoracentesis yielding 590 mL of pleural fluid. No pneumothorax on post-procedure chest x-ray. Read by: Corrin Parker, PA-C Electronically Signed   By: Irish Lack M.D.   On: 05/22/2018 11:40     Medications:  Scheduled: . levofloxacin  750 mg Oral Daily   Continuous:  ZOX:[id number] **OR** acetaminophen, ondansetron **OR** ondansetron (ZOFRAN) IV, oxyCODONE  Assessment/Plan:    Left-sided pleural effusion This was noted to be large on imaging studies.  Etiology is unclear.  Patient does not have any fever.  Has a normal WBC.  Patient does smoke and has been smoking for 30+ years.  Denies any weight loss though.  Patient underwent thoracentesis yesterday.  WBC noted to be 10,000 with predominant neutrophils.  LDH noted to be elevated.  Pleural fluid analysis meets criteria for it to be an exudative effusion.  Discussed with pulmonology yesterday.  Antibiotics was recommended.  Levaquin was started.  Fluid was sent for culture, cytology, AFB.  These are all pending.   Left thyroid nodule TSH is normal.  Ultrasound of the thyroid does show a nodule and fine-needle aspiration biopsy is recommended.  Apparently patient does have a primary care provider however it remains unclear if patient has been following with this person or not.  In any case I think patient is at high risk for loss to follow-up in view of this he will benefit from biopsy to be done as an inpatient.  Interventional radiology has been consulted.  Left-sided chest pain and back pain Most likely secondary to the pleural effusion.  EKG showed LVH.  No obvious ischemic changes.  Troponin was normal.  Mild cardiomegaly noted on chest x-ray.  Echocardiogram is pending.  Elevated blood pressure No diagnosis of hypertension but it is quite possible he has hypertension considering LVH noted on EKG.  Continue to  monitor for now.  He will likely need definitive treatment.  Consider ACE inhibitor.  DVT Prophylaxis: SCDs    Code Status: Full code Family Communication: Discussed with the patient Disposition Plan: Patient lives with his uncle.  Mobilize as tolerated.  Await cytology from pleural fluid.  Await thyroid biopsy.  Await echocardiogram.    LOS: 0 days   Osvaldo Shipper  [facility] Pager [phone] 05/23/2018, 8:53 AM  If 7PM-7AM, please contact night-coverage at [email or web address], password [facility]

## 2018-05-24 ENCOUNTER — Inpatient Hospital Stay (HOSPITAL_COMMUNITY): Payer: Self-pay

## 2018-05-24 DIAGNOSIS — I10 Essential (primary) hypertension: Secondary | ICD-10-CM

## 2018-05-24 DIAGNOSIS — J90 Pleural effusion, not elsewhere classified: Principal | ICD-10-CM

## 2018-05-24 LAB — PROCALCITONIN: Procalcitonin: <0.1 ng/mL

## 2018-05-24 LAB — PH, BODY FLUID: PH, BODY FLUID: 7

## 2018-05-24 LAB — CBC
HEMATOCRIT: 46.3 % (ref 39.0–52.0)
Hemoglobin: 14.8 g/dL (ref 13.0–17.0)
MCH: 30.8 pg (ref 26.0–34.0)
MCHC: 32 g/dL (ref 30.0–36.0)
MCV: 96.5 fL (ref 78.0–100.0)
PLATELETS: 291 10*3/uL (ref 150–400)
RBC: 4.8 MIL/uL (ref 4.22–5.81)
RDW: 13.2 % (ref 11.5–15.5)
WBC: 8.4 10*3/uL (ref 4.0–10.5)

## 2018-05-24 LAB — BASIC METABOLIC PANEL
ANION GAP: 10 (ref 5–15)
BUN: 8 mg/dL (ref 6–20)
CHLORIDE: 100 mmol/L (ref 98–111)
CO2: 29 mmol/L (ref 22–32)
Calcium: 9.1 mg/dL (ref 8.9–10.3)
Creatinine, Ser: 0.8 mg/dL (ref 0.61–1.24)
GFR calc Af Amer: >60 mL/min (ref >60)
GLUCOSE: 99 mg/dL (ref 70–99)
Potassium: 4.3 mmol/L (ref 3.5–5.1)
Sodium: 139 mmol/L (ref 135–145)

## 2018-05-24 MED ORDER — LIDOCAINE HCL (PF) 1 % IJ SOLN
INTRAMUSCULAR | Status: AC
  Filled 2018-05-24: qty 30

## 2018-05-24 MED ORDER — KETOROLAC TROMETHAMINE 15 MG/ML IJ SOLN
15.0000 mg | Freq: Four times a day (QID) | INTRAMUSCULAR | Status: DC | PRN
  Administered 2018-05-24 – 2018-05-26 (×5): 15 mg via INTRAVENOUS
  Filled 2018-05-24 (×5): qty 1

## 2018-05-24 MED ORDER — LISINOPRIL 10 MG PO TABS
10.0000 mg | ORAL_TABLET | Freq: Every day | ORAL | Status: DC
  Administered 2018-05-24 – 2018-05-26 (×3): 10 mg via ORAL
  Filled 2018-05-24 (×2): qty 1

## 2018-05-24 NOTE — Progress Notes (Signed)
PCCM  Attempted left sided thoracentesis with ultrasound guidance. Identified pocket of fluid. Fluid appeared complicated in nature, but was able to identify a window for diagnostic tap. After consent, sterile prep, and local anesthesia with 1% lidocaine, finder needle was advanced. Able to only withdrawal a scant amount (5cc) of amber appearing pleural fluid and was unable to obtain any more. Repositioned needle 3-4 times and was unable to re-locate the fluid collection. Thoracentesis attempt aborted. CXR ordered.   Joneen RoachPaul Ceylon Arenson, AGACNP-BC [facility] Pager [phone] or [phone]  05/24/2018 5:18 PM

## 2018-05-24 NOTE — Progress Notes (Addendum)
[facility] PROGRESS NOTE  John Rodriguez JXB:[id number] DOB: 12/08/1965 DOA: 05/21/2018  PCP: Lavinia Sharps, NP  Brief History/Interval Summary: 52 year old African-American male with a past medical history of tobacco abuse and no other significant health issues presented one 1 week history of left-sided chest and back pain and some shortness of breath.  Denies any fever or chills.  No weight loss.  No sick contacts recently.  No travel recently.  He was found to have sinus tachycardia with evidence for LVH on EKG.  CT scan shows a large left-sided pleural effusion.  Patient also found to have a left thyroid nodule.  Reason for Visit: Large left pleural effusion.  Left thyroid nodule.  Consultants: Interventional radiology.  Phone discussion with pulmonology  Procedures:  Thoracentesis on 7/28  Biopsy of the thyroid nodule is pending  Transthoracic echocardiogram Study Conclusions  - Left ventricle: The cavity size was normal. Systolic function was   normal. The estimated ejection fraction was in the range of 55%   to 60%. Wall motion was normal; there were no regional wall   motion abnormalities. Left ventricular diastolic function   parameters were normal. - Ventricular septum: Septal motion showed paradox. - Pulmonary arteries: Systolic pressure could not be accurately   estimated.  Antibiotics: None  Subjective/Interval History: Patient states that he feels about the same.  No difficulty breathing at this time.  Some pain in the left chest and back area.    ROS: Denies any nausea or vomiting  Objective:  Vital Signs  Vitals:   05/23/18 0734 05/23/18 1654 05/23/18 2300 05/24/18 0734  BP: (!) 152/97 (!) 149/94 138/88 (!) 145/97  Pulse: 87 85 86 88  Resp: 19 18 18 18   Temp: 99.2 F (37.3 C)  99.3 F (37.4 C)   TempSrc: Oral  Oral   SpO2: 91% 96% 96% 93%  Weight:      Height:        Intake/Output Summary (Last 24 hours) at 05/24/2018 1218 Last  data filed at 05/23/2018 2200 Gross per 24 hour  Intake 888 ml  Output -  Net 888 ml   Filed Weights   05/21/18 1525  Weight: 83.9 kg (185 lb)    General appearance: Awake alert.  In no distress Resp: Normal effort.  Dullness to percussion on the left persist.  Few crackles at the bases.  No wheezing or rhonchi.    Cardio: S1-S2 is normal regular.  No S3-S4.  No rubs murmurs or bruit GI: Abdomen is soft.  Nontender nondistended.  Bowel sounds are present.  No masses organomegaly Neurologic: Alert and oriented x3.  No focal neurological deficits.  Lab Results:  Data Reviewed: I have personally reviewed following labs and imaging studies  CBC: Recent Labs  Lab 05/21/18 1532 05/22/18 0148 05/24/18 0231  WBC 9.3 8.9 8.4  HGB 15.1 13.9 14.8  HCT 47.9 42.9 46.3  MCV 97.6 95.3 96.5  PLT 257 219 291    Basic Metabolic Panel: Recent Labs  Lab 05/21/18 1532 05/22/18 0148 05/24/18 0231  NA 138 136 139  K 4.4 3.9 4.3  CL 99 98 100  CO2 30 27 29   GLUCOSE 127* 139* 99  BUN 11 7 8   CREATININE 0.78 0.64 0.80  CALCIUM 9.5 9.0 9.1    GFR: Estimated Creatinine Clearance: 111.8 mL/min (by C-G formula based on SCr of 0.8 mg/dL).  Liver Function Tests: Recent Labs  Lab 05/22/18 0148  AST 20  ALT 22  ALKPHOS  46  BILITOT 0.7  PROT 6.7  ALBUMIN 3.1*     Cardiac Enzymes: Recent Labs  Lab 05/22/18 0148 05/22/18 0816 05/22/18 1449  TROPONINI <0.03 <0.03 <0.03    Thyroid Function Tests: Recent Labs    05/22/18 0148  TSH 0.946     Radiology Studies: No results found.   Medications:  Scheduled: . levofloxacin  750 mg Oral Daily   Continuous:  ZOX:[id number]:acetaminophen **OR** acetaminophen, ondansetron **OR** ondansetron (ZOFRAN) IV, oxyCODONE  Assessment/Plan:    Left-sided pleural effusion This was noted to be large on imaging studies.  Etiology is unclear.  Patient does not have any fever.  Has a normal WBC.  Patient does smoke and has been smoking for  30+ years.  Denies any weight loss though.  Patient underwent thoracentesis 7/28.  Fluid analysis showed WBC noted to be 10,000 with predominant neutrophils.  LDH noted to be elevated.  Pleural fluid analysis meets criteria for it to be an exudative effusion.  Discussed with pulmonology.  Antibiotics was recommended.  Levaquin was started.  Fluid was sent for culture, cytology, AFB.  No AFB noted on smear.  Cultures negative so far.  Cytology is pending.  Repeat chest x-ray today.  Repeat chest x-ray shows similar changes to previous.  Discussed with Dr. Sherryll BurgerShah with pulmonology today who recommends waiting for the cytology.  If cytology is negative may need repeat thoracentesis to fully drain the fluid.  At that time pulmonology will need to be consulted.  ADDENDUM: Cytology without any malignant cells.  Discussed with Dr. Sherryll BurgerShah who will consult.  Left thyroid nodule TSH is normal.  Ultrasound of the thyroid does show a nodule and fine-needle aspiration biopsy is recommended.  Apparently patient does have a primary care provider however it remains unclear if patient has been following with this person or not.  In any case I think patient is at high risk for loss to follow-up. In view of this he will benefit from biopsy to be done as an inpatient.  Interventional radiology has been consulted.   Left-sided chest pain and back pain Most likely secondary to the pleural effusion.  EKG showed LVH.  No obvious ischemic changes.  Troponin was normal.  Mild cardiomegaly noted on chest x-ray.  Echocardiogram as above.  Elevated blood pressure/essential hypertension No diagnosis of hypertension but it is quite possible he has hypertension considering LVH noted on EKG.  Continue to monitor for now.  He will likely need definitive treatment.  Could consider ACE inhibitor.  We will initiate lisinopril.  DVT Prophylaxis: SCDs    Code Status: Full code Family Communication: Discussed with the patient Disposition Plan:  Patient lives with his uncle. Await cytology from pleural fluid.  Await thyroid biopsy.  Mobilize.    LOS: 1 day   Osvaldo ShipperGokul Tijuan Dantes  [facility] Pager [phone] 05/24/2018, 12:18 PM  If 7PM-7AM, please contact night-coverage at [email or web address], password [email or web address]

## 2018-05-24 NOTE — Consult Note (Addendum)
Name: John Rodriguez MRN: [id number] DOB: November 16, 1965    ADMISSION DATE:  05/21/2018 CONSULTATION DATE:  7/30 REFERRING MD :  [place] Pink   CHIEF COMPLAINT:  Pleural effusion   BRIEF PATIENT DESCRIPTION/HPI  This is a 52 year old male w/ only PMH of smoking. Presented on 7/27 w/ about 1 week h/o pleuritic type CP  on the left w/ associated shortness of breath. This was following about 3 days of URI type symptoms: low grade subjective fever, generalized malaise, cough productive of yellow sputum and chest congestion.  A CT of chest was obtained this showed large left pleural effusion as well as left thyroid nodule. ->he underwent thoracentesis on 7/28: removed 590 ml tea colored fluid.  Cytology was w/out malignant cells, cultures to date have been megaitve, LDH was elevated and met criteria for exudative process.  ->on 7/30 his CXR shows some increase in left pleural fluid.  ->PCCM asked to evaluate for Exudative pleural effusion of unclear etiology.    STUDIES:  CT chest 7/27: moderate left pleural effusion w/ associated left basilar and lingular atx Left thoracentesis 7/28: 560m tea colored. LDH: 524, triglycerides: 22, wbc 10K, 81 neutrophils, lymph 5. Amylase: 77, total protein 5.1, ph 7, AFB smear neg, gm stain neg. cytology: neg  ECHO 7/29: LVEF 55-60% no WM abnormality. nml diastolic fxn   PAST MEDICAL HISTORY :   has no past medical history on file.  has no past surgical history on file. Prior to Admission medications   Medication Sig Start Date End Date Taking? Authorizing Provider  naproxen sodium (ALEVE) 220 MG tablet Take 220 mg by mouth 2 (two) times daily as needed (for pain).   Yes [provider]   No Known Allergies  FAMILY HISTORY:  family history includes Prostate cancer in his father. SOCIAL HISTORY:  reports that he has been smoking.  He has never used smokeless tobacco. He reports that he drinks alcohol.  Works with manual labor.  Had been working  for [employer] outdoor work primarily.  He does not recall exposure to  toxic chemicals or dusts. No recent incarceration Currently works for [employer]   Constitutional: Negative for fever, chills, weight loss, recent malaise  and diaphoresis.  HENT: Negative for hearing loss, ear pain, nosebleeds, recent congestion, sore throat, neck pain, tinnitus and ear discharge.   Eyes: Negative for blurred vision, double vision, photophobia, pain, discharge and redness.  Respiratory: recent productive cough, no hemoptysis, sputum production yellow, shortness of breath, wheezing and stridor.   Cardiovascular: Negative for chest pain, palpitations, orthopnea, claudication, leg swelling and PND.  Gastrointestinal: Negative for heartburn, nausea, vomiting, abdominal pain, diarrhea, constipation, blood in stool and melena.  Genitourinary: Negative for dysuria, urgency, frequency, hematuria and flank pain.  Musculoskeletal: Negative for myalgias, back pain, joint pain and falls.  Skin: Negative for itching and rash.  Neurological: Negative for dizziness, tingling, tremors, sensory change, speech change, focal weakness, seizures, loss of consciousness, weakness and headaches.  Endo/Heme/Allergies: Negative for environmental allergies and polydipsia. Does not bruise/bleed easily.  SUBJECTIVE:  Still feels chest is tight VITAL SIGNS: Temp:  [99.3 F (37.4 C)] 99.3 F (37.4 C) (07/29 2300) Pulse Rate:  [85-88] 88 (07/30 0734) Resp:  [18] 18 (07/30 0734) BP: (138-149)/(88-97) 145/97 (07/30 0734) SpO2:  [93 %-96 %] 93 % (07/30 0734)  PHYSICAL EXAMINATION: General: Pleasant 52year old male patient currently sitting up in bed he is in no acute distress.  He  is holding pressure dressing on his neck following a recent thyroid needle aspiration Neuro: Awake oriented no focal deficits HEENT: Normocephalic atraumatic no jugular venous distention mucous membranes  moist Cardiovascular: Regular rate and rhythm no murmur rub or gallop Lungs: Decreased left side no accessory use Abdomen: Soft nontender no organomegaly Musculoskeletal: Equal strength and bulk no clubbing no joint irregularity Skin: Warm and dry no edema  Recent Labs  Lab 05/21/18 1532 05/22/18 0148 05/24/18 0231  NA 138 136 139  K 4.4 3.9 4.3  CL 99 98 100  CO2 '30 27 29  ' BUN '11 7 8  ' CREATININE 0.78 0.64 0.80  GLUCOSE 127* 139* 99   Recent Labs  Lab 05/21/18 1532 05/22/18 0148 05/24/18 0231  HGB 15.1 13.9 14.8  HCT 47.9 42.9 46.3  WBC 9.3 8.9 8.4  PLT 257 219 291   Dg Chest 2 View  Result Date: 05/24/2018 CLINICAL DATA:  Left-sided chest pain.  Shortness of breath. EXAM: CHEST - 2 VIEW COMPARISON:  May 22, 2018 FINDINGS: There is a moderate left-sided pleural effusion with underlying opacity, similar to slightly more prominent the interval. No pneumothorax. No other interval changes. IMPRESSION: Moderate left-sided pleural effusion, similar to slightly larger in the interval. No other interval change. Electronically Signed   By: Dorise Bullion III M.D   On: 05/24/2018 13:34    ASSESSMENT / PLAN:  Left exudative pleural effusion Possible community-acquired pneumonia Left thyroid mass Tobacco abuse   Discussion: Left exudative pleural effusion -Etiology unclear.  He did have recent URI/cold symptoms so this could simply be simply parapneumonic process in the setting of CAP.  Also in the setting of thyroid mass malignancy is not excluded, even in setting of initial cytology being negative  Plan Cont current abx (day # 3 levaquin) Repeat left thoracentesis.  We will repeat cytology, also repeat cultures, send rheumatoid level.  Plan will be to drain effusion dry and follow serial imaging.  Fluid not completely drained we may need to repeat noncontrast CT imaging Follow-up pending pathology from needle aspiration of thyroid  Erick Colace ACNP-BC [place] Pager # [phone] OR # [phone] if no answer   05/24/2018, 3:38 PM

## 2018-05-25 DIAGNOSIS — J90 Pleural effusion, not elsewhere classified: Secondary | ICD-10-CM

## 2018-05-25 DIAGNOSIS — Z9889 Other specified postprocedural states: Secondary | ICD-10-CM

## 2018-05-25 LAB — APTT: aPTT: 32 seconds (ref 24–36)

## 2018-05-25 NOTE — Progress Notes (Signed)
Name: John Rodriguez MRN: [id number] DOB: 1966-07-24    ADMISSION DATE:  05/21/2018 CONSULTATION DATE:  7/30 REFERRING MD :  [place] Pink   CHIEF COMPLAINT:  Pleural effusion   BRIEF PATIENT DESCRIPTION: 52 year old male w/ only PMH of smoking. Presented on 7/27 w/ about 1 week h/o pleuritic type CP  on the left w/ associated shortness of breath. This was following about 3 days of URI type symptoms: low grade subjective fever, generalized malaise, cough productive of yellow sputum and chest congestion.  A CT of chest was obtained this showed large left pleural effusion as well as left thyroid nodule. ->he underwent thoracentesis on 7/28: removed 590 ml tea colored fluid.  Cytology was w/out malignant cells, cultures to date have been negaitve, LDH was elevated and met criteria for exudative process.  ->on 7/30 his CXR shows some increase in left pleural fluid.  ->PCCM asked to evaluate for Exudative pleural effusion of unclear etiology.    SUBJECTIVE:  Pt reports pain in left chest/back and side.  Denies significant shortness of breath, fevers or chills.    VITAL SIGNS: Temp:  [98.6 F (37 C)-99.5 F (37.5 C)] 98.6 F (37 C) (07/31 0724) Pulse Rate:  [75-91] 91 (07/31 0724) Resp:  [18] 18 (07/31 0212) BP: (113-144)/(84-90) 137/87 (07/31 0724) SpO2:  [93 %-98 %] 98 % (07/31 0724)  PHYSICAL EXAMINATION: General:  Non-toxic appearing adult male in NAD, lying in bed HEENT: MM pink/moist, no jvd, good dentition  Neuro: AAOx4, speech clear, MAE  CV: s1s2 rrr, no m/r/g PULM: even/non-labored, clear on right, diminished on left  ZT:IWPY, non-tender, bsx4 active  Extremities: warm/dry, no edema  Skin: no rashes or lesions  Recent Labs  Lab 05/21/18 1532 05/22/18 0148 05/24/18 0231  NA 138 136 139  K 4.4 3.9 4.3  CL 99 98 100  CO2 '30 27 29  ' BUN '11 7 8  ' CREATININE 0.78 0.64 0.80  GLUCOSE 127* 139* 99   Recent Labs  Lab 05/21/18 1532 05/22/18 0148 05/24/18 0231  HGB 15.1  13.9 14.8  HCT 47.9 42.9 46.3  WBC 9.3 8.9 8.4  PLT 257 219 291   Dg Chest 2 View  Result Date: 05/24/2018 CLINICAL DATA:  Left-sided chest pain.  Shortness of breath. EXAM: CHEST - 2 VIEW COMPARISON:  May 22, 2018 FINDINGS: There is a moderate left-sided pleural effusion with underlying opacity, similar to slightly more prominent the interval. No pneumothorax. No other interval changes. IMPRESSION: Moderate left-sided pleural effusion, similar to slightly larger in the interval. No other interval change. Electronically Signed   By: Dorise Bullion III M.D   On: 05/24/2018 13:34   US Thyroid Fna Each Nodule  Result Date: 05/24/2018 INDICATION: Indeterminate thyroid nodule Left thyroid nodule 3.7 cm EXAM: ULTRASOUND GUIDED FINE NEEDLE ASPIRATION OF INDETERMINATE THYROID NODULE COMPARISON:  US Thyroid 05/22/18 MEDICATIONS: 6 cc 1% lidocaine COMPLICATIONS: None immediate. TECHNIQUE: Informed written consent was obtained from the patient after a discussion of the risks, benefits and alternatives to treatment. Questions regarding the procedure were encouraged and answered. A timeout was performed prior to the initiation of the procedure. Pre-procedural ultrasound scanning demonstrated unchanged size and appearance of the indeterminate nodule within the left thyroid The procedure was planned. The neck was prepped in the usual sterile fashion, and a sterile drape was applied covering the operative field. A timeout was performed prior to the initiation of the procedure. Local anesthesia was provided with 1% lidocaine. Under direct ultrasound guidance, 5 FNA  biopsies were performed of the left thyroid nodule with a 25 gauge needle. 2 of these samples were obtained for [facility] per ordering MD Multiple ultrasound images were saved for procedural documentation purposes. The samples were prepared and submitted to pathology. Limited post procedural scanning was negative for hematoma or additional complication. Dressings  were placed. The patient tolerated the above procedures procedure well without immediate postprocedural complication. FINDINGS: Nodule reference number based on prior diagnostic ultrasound: 1 Maximum size: 3.7 cm Location: Left; Inferior ACR TI-RADS risk category: TR3 (3 points) Reason for biopsy: meets ACR TI-RADS criteria Ultrasound imaging confirms appropriate placement of the needles within the thyroid nodule. IMPRESSION: Technically successful ultrasound guided fine needle aspiration of left thyroid nodule Read by [facility] Electronically Signed   By: Marybelle Killings M.D.   On: 05/24/2018 16:09   Dg Chest Port 1 View  Result Date: 05/24/2018 CLINICAL DATA:  Post left thoracentesis EXAM: PORTABLE CHEST 1 VIEW COMPARISON:  05/24/2018 FINDINGS: No significant change in left pleural effusion which is moderately large. Left lower lobe consolidation unchanged. Negative for pneumothorax post left thoracentesis Right lung remains clear IMPRESSION: Negative for pneumothorax post left thoracentesis. Little change in moderate large left effusion and left lower lobe consolidation. Electronically Signed   By: Franchot Gallo M.D.   On: 05/24/2018 17:48    STUDIES:  CT chest 7/27>> moderate left pleural effusion w/ associated left basilar and lingular atx Left thoracentesis 7/28 >> 529m tea colored. LDH: 524, triglycerides: 22, wbc 10K, 81 neutrophils, lymph 5. Amylase: 77, total protein 5.1, ph 7, AFB smear neg, gm stain neg. cytology: neg  ECHO 7/29 >> LVEF 55-60% no WM abnormality. nml diastolic fxn  Pleural fluid 7/30 >> glucose 74, LDH 524, pH 7, WBC 10k (81% neutrophils), total protein 5.1  CUTLURES Pleural fluid 7/30 >>   CYTOLOGY  Thyroid 7/30 >> Pleural fluid 7/30 >> negative for malignant cells  ASSESSMENT / PLAN:  Left exudative pleural effusion Possible community-acquired pneumonia Left thyroid mass Tobacco abuse  Discussion: Left exudative pleural effusion - etiology unclear.   He did have recent URI/cold symptoms so this could simply be simply parapneumonic process in the setting of CAP.  Also in the setting of thyroid mass malignancy is not excluded, even in setting of initial cytology being negative.    P: Continue levaquin, D4/x  Unable to drain pleural fluid by thoracentesis 7/30, may need small bore chest tube to facilitate drainage. Consult TCTS for evaluation  Needs repeat cytology, cultures and rheumatoid level Await pathology from thyroid biopsy  If able to drain pleural space, repeat CT imaging to assess parenchyma    BNoe Gens NP-C [facility] Pgr: [phone] or if no answer [phone] 05/25/2018, 1:58 PM

## 2018-05-25 NOTE — Plan of Care (Signed)
Discussed plan of care with patient for the evening shift.  Emphasized using the call button if assistance is needed.  Good teach back displayed.

## 2018-05-25 NOTE — Progress Notes (Signed)
PROGRESS NOTE    John Rodriguez  JXB:[id number] DOB: May 16, 1966 DOA: 05/21/2018 PCP: Lavinia Sharps, NP    Brief Narrative:  52 year old male who presented with chest pain.  He does have significant past medical history of tobacco abuse.  Reported 1 week history of chest pain at the left side nonexertional related, no improving or worsening factors.  No cough or fever.  His initial physical examination blood pressure 158/94, heart rate 91, respiratory 21, oxygen saturation 95%.  His lungs had decreased breath sounds at the left base, no wheezing, rales or rhonchi, heart S1-S2 present rhythmic, abdomen soft nontender, no lower extremity edema.  His chest x-ray showed left pleural effusion.  Patient was admitted to the hospital working diagnosis of left pleural effusion.   Assessment & Plan:   Principal Problem:   Pleural effusion on left Active Problems:   Tobacco abuse  1. Left exudate pleural effusion/ possible parapneumonic effusion, present on admission. Pleural cytology with no malignant cells, follow up chest film with persistent effusion, failed second paracentesis. Likely chronic effusion. Will need close follow up as outpatient. CT chest with no lung masses. Will continue levofloxacin for 5 days.   2. Thyroid nodule. Pending pathology results. TSH with normal value.   3. HTN. Patient placed on lisinopril, blood pressure with improved controlled to 137 mmHg systolic.    DVT prophylaxis: enoxaparin   Code Status:  full Family Communication: no family at the bedside  Disposition Plan/ discharge barriers: Plan to discharge in am, if no further pulmonary workup.    Consultants:   Pulmonary   Procedures:     Antimicrobials:       Subjective: Patient reports mild pleuritic chest pain on the left, dyspnea has improved, no nausea or vomiting.   Objective: Vitals:   05/24/18 0734 05/24/18 1639 05/25/18 0212 05/25/18 0724  BP: (!) 145/97 (!) 144/90 113/84 137/87    Pulse: 88 80 75 91  Resp: 18 18 18    Temp:  99.5 F (37.5 C) 98.6 F (37 C) 98.6 F (37 C)  TempSrc:  Oral Oral Oral  SpO2: 93% 96% 93% 98%  Weight:      Height:       No intake or output data in the 24 hours ending 05/25/18 0844 Filed Weights   05/21/18 1525  Weight: 83.9 kg (185 lb)    Examination:   General: Not in pain or dyspnea, deconditioned  Neurology: Awake and alert, non focal  E ENT: mild pallor, no icterus, oral mucosa moist Cardiovascular: No JVD. S1-S2 present, rhythmic, no gallops, rubs, or murmurs. No lower extremity edema. Pulmonary: decreased breath sounds at the left base, dull to percussion, no wheezing, rhonchi or rales. Gastrointestinal. Abdomen with no organomegaly, non tender, no rebound or guarding Skin. No rashes Musculoskeletal: no joint deformities     Data Reviewed: I have personally reviewed following labs and imaging studies  CBC: Recent Labs  Lab 05/21/18 1532 05/22/18 0148 05/24/18 0231  WBC 9.3 8.9 8.4  HGB 15.1 13.9 14.8  HCT 47.9 42.9 46.3  MCV 97.6 95.3 96.5  PLT 257 219 291   Basic Metabolic Panel: Recent Labs  Lab 05/21/18 1532 05/22/18 0148 05/24/18 0231  NA 138 136 139  K 4.4 3.9 4.3  CL 99 98 100  CO2 30 27 29   GLUCOSE 127* 139* 99  BUN 11 7 8   CREATININE 0.78 0.64 0.80  CALCIUM 9.5 9.0 9.1   GFR: Estimated Creatinine Clearance: 111.8 mL/min (by C-G  formula based on SCr of 0.8 mg/dL). Liver Function Tests: Recent Labs  Lab 05/22/18 0148  AST 20  ALT 22  ALKPHOS 46  BILITOT 0.7  PROT 6.7  ALBUMIN 3.1*   No results for input(s): LIPASE, AMYLASE in the last 168 hours. No results for input(s): AMMONIA in the last 168 hours. Coagulation Profile: No results for input(s): INR, PROTIME in the last 168 hours. Cardiac Enzymes: Recent Labs  Lab 05/22/18 0148 05/22/18 0816 05/22/18 1449  TROPONINI <0.03 <0.03 <0.03   BNP (last 3 results) No results for input(s): PROBNP in the last 8760  hours. HbA1C: No results for input(s): HGBA1C in the last 72 hours. CBG: No results for input(s): GLUCAP in the last 168 hours. Lipid Profile: No results for input(s): CHOL, HDL, LDLCALC, TRIG, CHOLHDL, LDLDIRECT in the last 72 hours. Thyroid Function Tests: No results for input(s): TSH, T4TOTAL, FREET4, T3FREE, THYROIDAB in the last 72 hours. Anemia Panel: No results for input(s): VITAMINB12, FOLATE, FERRITIN, TIBC, IRON, RETICCTPCT in the last 72 hours.    Radiology Studies: I have reviewed all of the imaging during this hospital visit personally     Scheduled Meds: . levofloxacin  750 mg Oral Daily  . lisinopril  10 mg Oral Daily   Continuous Infusions:   LOS: 2 days        Raychell Holcomb Annett Gulaaniel Mickie Badders, MD [facility] Pager [phone]

## 2018-05-25 NOTE — Progress Notes (Signed)
Procedure(s) (LRB): VIDEO ASSISTED THORACOSCOPY (VATS)/EMPYEMA (Left) Subjective: Patient examined, images of recent chest x-ray, CT scan of chest and echocardiogram personally reviewed.  52 year old smoker without much previous medical history admitted with left chest pain shortness of breath and left lower lobe pneumonia with loculated pleural effusion.  Thoracentesis was through 500 cc fluid which was positive for white cells and protein but negative for malignant cells and no growth on culture.  The loculated effusion reaccumulated almost immediately after the thoracentesis and the patient does not feel improved.  Left VATS for drainage of empyema and decortication of lung is indicated.  Surgery will be scheduled for Friday, August 2.  Procedure discussed with patient understands the reason for surgery and the expected benefits as well as the associated risks.  Objective: Vital signs in last 24 hours: Temp:  [98.6 F (37 C)-99 F (37.2 C)] 99 F (37.2 C) (07/31 1545) Pulse Rate:  [75-91] 90 (07/31 1545) Cardiac Rhythm: Normal sinus rhythm (07/31 0900) Resp:  [16-18] 16 (07/31 1545) BP: (113-137)/(83-87) 128/83 (07/31 1545) SpO2:  [93 %-98 %] 94 % (07/31 1545)  Hemodynamic parameters for last 24 hours:  Sinus rhythm  Intake/Output from previous day: No intake/output data recorded. Intake/Output this shift: No intake/output data recorded.  Significant decreased breath sounds on the left Heart rate regular without gallop or murmur Extremities without edema Neuro intact  Lab Results: Recent Labs    05/24/18 0231  WBC 8.4  HGB 14.8  HCT 46.3  PLT 291   BMET:  Recent Labs    05/24/18 0231  NA 139  K 4.3  CL 100  CO2 29  GLUCOSE 99  BUN 8  CREATININE 0.80  CALCIUM 9.1    PT/INR: No results for input(s): LABPROT, INR in the last 72 hours. ABG No results found for: PHART, HCO3, TCO2, ACIDBASEDEF, O2SAT CBG (last 3)  No results for input(s): GLUCAP in the last 72  hours.  Assessment/Plan: S/P Procedure(s) (LRB): VIDEO ASSISTED THORACOSCOPY (VATS)/EMPYEMA (Left) Plan left VATS for drainage of empyema and decortication of lung August 2. Start IV antibiotics broad-spectrum  LOS: 2 days    Kathlee Nationseter Van Trigt III 05/25/2018

## 2018-05-25 NOTE — Plan of Care (Signed)
Discussed plan of care for the evening and pain management with patient.  Patient is scheduled for surgery on 05/27/18.  Encouraged patient to use call button if assistance is needed.  Good teach back displayed.

## 2018-05-26 ENCOUNTER — Inpatient Hospital Stay (HOSPITAL_COMMUNITY): Payer: Self-pay

## 2018-05-26 LAB — COMPREHENSIVE METABOLIC PANEL
ALT: 45 U/L — ABNORMAL HIGH (ref 0–44)
ALT: 46 U/L — ABNORMAL HIGH (ref 0–44)
AST: 38 U/L (ref 15–41)
AST: 35 U/L (ref 15–41)
Albumin: 2.7 g/dL — ABNORMAL LOW (ref 3.5–5.0)
Albumin: 3 g/dL — ABNORMAL LOW (ref 3.5–5.0)
Alkaline Phosphatase: 67 U/L (ref 38–126)
Alkaline Phosphatase: 67 U/L (ref 38–126)
Anion gap: 10 (ref 5–15)
Anion gap: 11 (ref 5–15)
BUN: 11 mg/dL (ref 6–20)
BUN: 12 mg/dL (ref 6–20)
CO2: 27 mmol/L (ref 22–32)
CO2: 28 mmol/L (ref 22–32)
Calcium: 8.9 mg/dL (ref 8.9–10.3)
Calcium: 9.4 mg/dL (ref 8.9–10.3)
Chloride: 102 mmol/L (ref 98–111)
Chloride: 101 mmol/L (ref 98–111)
Creatinine, Ser: 0.73 mg/dL (ref 0.61–1.24)
Creatinine, Ser: 0.84 mg/dL (ref 0.61–1.24)
GFR calc Af Amer: >60 mL/min (ref >60)
GFR calc Af Amer: >60 mL/min (ref >60)
GFR calc non Af Amer: >60 mL/min (ref >60)
GFR calc non Af Amer: >60 mL/min (ref >60)
Glucose, Bld: 108 mg/dL — ABNORMAL HIGH (ref 70–99)
Glucose, Bld: 137 mg/dL — ABNORMAL HIGH (ref 70–99)
Potassium: 4.1 mmol/L (ref 3.5–5.1)
Potassium: 4.2 mmol/L (ref 3.5–5.1)
Sodium: 139 mmol/L (ref 135–145)
Sodium: 140 mmol/L (ref 135–145)
Total Bilirubin: 0.7 mg/dL (ref 0.3–1.2)
Total Bilirubin: 0.7 mg/dL (ref 0.3–1.2)
Total Protein: 6.5 g/dL (ref 6.5–8.1)
Total Protein: 6.9 g/dL (ref 6.5–8.1)

## 2018-05-26 LAB — URINALYSIS, ROUTINE W REFLEX MICROSCOPIC
Bilirubin Urine: NEGATIVE
Glucose, UA: NEGATIVE mg/dL
Hgb urine dipstick: NEGATIVE
Ketones, ur: NEGATIVE mg/dL
Leukocytes, UA: NEGATIVE
Nitrite: NEGATIVE
Protein, ur: NEGATIVE mg/dL
Specific Gravity, Urine: 1.038 — ABNORMAL HIGH (ref 1.005–1.030)
pH: 5 (ref 5.0–8.0)

## 2018-05-26 LAB — CBC
HCT: 45.4 % (ref 39.0–52.0)
Hemoglobin: 14.5 g/dL (ref 13.0–17.0)
MCH: 30.4 pg (ref 26.0–34.0)
MCHC: 31.9 g/dL (ref 30.0–36.0)
MCV: 95.2 fL (ref 78.0–100.0)
Platelets: 308 10*3/uL (ref 150–400)
RBC: 4.77 MIL/uL (ref 4.22–5.81)
RDW: 13 % (ref 11.5–15.5)
WBC: 8.2 10*3/uL (ref 4.0–10.5)

## 2018-05-26 LAB — BLOOD GAS, ARTERIAL
Acid-Base Excess: 4.6 mmol/L — ABNORMAL HIGH (ref 0.0–2.0)
Bicarbonate: 28.5 mmol/L — ABNORMAL HIGH (ref 20.0–28.0)
Drawn by: 27027
FIO2: 21
O2 Saturation: 92.7 %
Patient temperature: 98.6
pCO2 arterial: 41.8 mmHg (ref 32.0–48.0)
pH, Arterial: 7.448 (ref 7.350–7.450)
pO2, Arterial: 68.5 mmHg — ABNORMAL LOW (ref 83.0–108.0)

## 2018-05-26 LAB — PREALBUMIN: Prealbumin: 11.3 mg/dL — ABNORMAL LOW (ref 18–38)

## 2018-05-26 LAB — PROTIME-INR
INR: 1.08
INR: 1.11
Prothrombin Time: 13.9 seconds (ref 11.4–15.2)
Prothrombin Time: 14.2 seconds (ref 11.4–15.2)

## 2018-05-26 LAB — ABO/RH: ABO/RH(D): B POS

## 2018-05-26 LAB — HEMOGLOBIN A1C
Hgb A1c MFr Bld: 5.6 % (ref 4.8–5.6)
Mean Plasma Glucose: 114.02 mg/dL

## 2018-05-26 LAB — PREPARE RBC (CROSSMATCH)

## 2018-05-26 LAB — APTT: aPTT: 36 seconds (ref 24–36)

## 2018-05-26 MED ORDER — IBUPROFEN 200 MG PO TABS
400.0000 mg | ORAL_TABLET | Freq: Four times a day (QID) | ORAL | Status: DC | PRN
  Administered 2018-05-26: 400 mg via ORAL
  Filled 2018-05-26: qty 2

## 2018-05-26 MED ORDER — SODIUM CHLORIDE 0.9 % IV SOLN
1.0000 g | INTRAVENOUS | Status: DC
  Administered 2018-05-26 – 2018-05-28 (×3): 1 g via INTRAVENOUS
  Filled 2018-05-26 (×3): qty 10

## 2018-05-26 MED ORDER — ENOXAPARIN SODIUM 40 MG/0.4ML ~~LOC~~ SOLN
40.0000 mg | SUBCUTANEOUS | Status: DC
  Administered 2018-05-26 – 2018-05-27 (×2): 40 mg via SUBCUTANEOUS
  Filled 2018-05-26 (×2): qty 0.4

## 2018-05-26 MED ORDER — CEFAZOLIN SODIUM-DEXTROSE 2-4 GM/100ML-% IV SOLN
2.0000 g | INTRAVENOUS | Status: AC
  Administered 2018-05-27: 2 g via INTRAVENOUS
  Filled 2018-05-26 (×2): qty 100

## 2018-05-26 MED ORDER — ENSURE ENLIVE PO LIQD
237.0000 mL | Freq: Three times a day (TID) | ORAL | Status: DC
  Administered 2018-05-26: 237 mL via ORAL

## 2018-05-26 NOTE — Progress Notes (Signed)
PROGRESS NOTE    John Rodriguez  ZOX:[id number] DOB: 04-Feb-1966 DOA: 05/21/2018 PCP: Lavinia Sharps, NP    Brief Narrative:  52 year old male who presented with chest pain.  He does have significant past medical history of tobacco abuse.  Reported 1 week history of chest pain at the left side nonexertional related, no improving or worsening factors.  No cough or fever.  His initial physical examination blood pressure 158/94, heart rate 91, respiratory 21, oxygen saturation 95%.  His lungs had decreased breath sounds at the left base, no wheezing, rales or rhonchi, heart S1-S2 present rhythmic, abdomen soft nontender, no lower extremity edema.  His chest x-ray showed left pleural effusion.  Patient was admitted to the hospital working diagnosis of left pleural effusion.   Assessment & Plan:   Principal Problem:   Pleural effusion on left Active Problems:   Tobacco abuse   S/P thoracentesis   1. Left exudate pleural effusion/ possible parapneumonic effusion, present on admission. Will continue IV antibiotic therapy with ceftriaxone, will add ibuprofen for pain control. Patient will have VATS in am with decortication, will follow on possible fluid analysis and biopsy.   2. Thyroid nodule. Biopsy with benign follicular nodule.    3. HTN. Continue lisinopril, for blood pressure control.   DVT prophylaxis: enoxaparin   Code Status:  full Family Communication: no family at the bedside  Disposition Plan/ discharge barriers: Plan to discharge in am, if no further pulmonary workup.    Consultants:   Pulmonary   Procedures:     Antimicrobials:       Subjective: Patient continue to have pain the left hemithorax, no cough or dyspnea, no wheezing, no nausea or vomiting, no fever or chills.   Objective: Vitals:   05/25/18 0724 05/25/18 1545 05/26/18 0023 05/26/18 0803  BP: 137/87 128/83 (!) 139/93 (!) 143/101  Pulse: 91 90 67 74  Resp:  16 18 20   Temp: 98.6 F  (37 C) 99 F (37.2 C) 97.8 F (36.6 C) 98.2 F (36.8 C)  TempSrc: Oral Oral  Oral  SpO2: 98% 94% 98% 97%  Weight:      Height:        Intake/Output Summary (Last 24 hours) at 05/26/2018 1501 Last data filed at 05/26/2018 0900 Gross per 24 hour  Intake 480 ml  Output 250 ml  Net 230 ml   Filed Weights   05/21/18 1525  Weight: 83.9 kg (185 lb)    Examination:   General: not in dyspnea Neurology: Awake and alert, non focal  E ENT: no pallor, no icterus, oral mucosa moist Cardiovascular: No JVD. S1-S2 present, rhythmic, no gallops, rubs, or murmurs. No lower extremity edema. Pulmonary: decreased breath sounds at the left base, no wheezing, rhonchi or rales. Gastrointestinal. Abdomen with no organomegaly, non tender, no rebound or guarding Skin. No rashes Musculoskeletal: no joint deformities     Data Reviewed: I have personally reviewed following labs and imaging studies  CBC: Recent Labs  Lab 05/21/18 1532 05/22/18 0148 05/24/18 0231 05/26/18 0822  WBC 9.3 8.9 8.4 8.2  HGB 15.1 13.9 14.8 14.5  HCT 47.9 42.9 46.3 45.4  MCV 97.6 95.3 96.5 95.2  PLT 257 219 291 308   Basic Metabolic Panel: Recent Labs  Lab 05/21/18 1532 05/22/18 0148 05/24/18 0231 05/26/18 0230 05/26/18 0822  NA 138 136 139 139 140  K 4.4 3.9 4.3 4.1 4.2  CL 99 98 100 102 101  CO2 30 27 29 27 28   GLUCOSE  127* 139* 99 108* 137*  BUN 11 7 8 11 12   CREATININE 0.78 0.64 0.80 0.73 0.84  CALCIUM 9.5 9.0 9.1 8.9 9.4   GFR: Estimated Creatinine Clearance: 106.5 mL/min (by C-G formula based on SCr of 0.84 mg/dL). Liver Function Tests: Recent Labs  Lab 05/22/18 0148 05/26/18 0230 05/26/18 0822  AST 20 38 35  ALT 22 45* 46*  ALKPHOS 46 67 67  BILITOT 0.7 0.7 0.7  PROT 6.7 6.5 6.9  ALBUMIN 3.1* 2.7* 3.0*   No results for input(s): LIPASE, AMYLASE in the last 168 hours. No results for input(s): AMMONIA in the last 168 hours. Coagulation Profile: Recent Labs  Lab 05/26/18 0230  05/26/18 0822  INR 1.08 1.11   Cardiac Enzymes: Recent Labs  Lab 05/22/18 0148 05/22/18 0816 05/22/18 1449  TROPONINI <0.03 <0.03 <0.03   BNP (last 3 results) No results for input(s): PROBNP in the last 8760 hours. HbA1C: Recent Labs    05/26/18 0230  HGBA1C 5.6   CBG: No results for input(s): GLUCAP in the last 168 hours. Lipid Profile: No results for input(s): CHOL, HDL, LDLCALC, TRIG, CHOLHDL, LDLDIRECT in the last 72 hours. Thyroid Function Tests: No results for input(s): TSH, T4TOTAL, FREET4, T3FREE, THYROIDAB in the last 72 hours. Anemia Panel: No results for input(s): VITAMINB12, FOLATE, FERRITIN, TIBC, IRON, RETICCTPCT in the last 72 hours.    Radiology Studies: I have reviewed all of the imaging during this hospital visit personally     Scheduled Meds: . enoxaparin (LOVENOX) injection  40 mg Subcutaneous Q24H  . feeding supplement (ENSURE ENLIVE)  237 mL Oral TID WC  . lisinopril  10 mg Oral Daily   Continuous Infusions: . [START ON 05/27/2018]  ceFAZolin (ANCEF) IV    . cefTRIAXone (ROCEPHIN)  IV 1 g (05/26/18 0435)     LOS: 3 days        Bethene Hankinson Annett Gulaaniel Imanni Burdine, MD [facility] Pager [phone]

## 2018-05-26 NOTE — Progress Notes (Signed)
The patient has declined the ABG this morning. He had an ABG taken at 0430 with normal values.

## 2018-05-27 ENCOUNTER — Inpatient Hospital Stay (HOSPITAL_COMMUNITY): Payer: Self-pay | Admitting: Anesthesiology

## 2018-05-27 ENCOUNTER — Encounter (HOSPITAL_COMMUNITY)
Admission: EM | Disposition: A | Discharge status: Home / Self Care | Payer: Self-pay | Source: Home / Self Care | Attending: Cardiothoracic Surgery

## 2018-05-27 ENCOUNTER — Inpatient Hospital Stay (HOSPITAL_COMMUNITY): Payer: Self-pay

## 2018-05-27 ENCOUNTER — Encounter (HOSPITAL_COMMUNITY): Payer: Self-pay | Admitting: Certified Registered Nurse Anesthetist

## 2018-05-27 DIAGNOSIS — J869 Pyothorax without fistula: Secondary | ICD-10-CM

## 2018-05-27 HISTORY — PX: VIDEO ASSISTED THORACOSCOPY (VATS)/EMPYEMA: SHX6172

## 2018-05-27 LAB — GRAM STAIN

## 2018-05-27 LAB — CULTURE, BODY FLUID W GRAM STAIN -BOTTLE

## 2018-05-27 LAB — BLOOD GAS, ARTERIAL
ACID-BASE EXCESS: 1.3 mmol/L (ref 0.0–2.0)
BICARBONATE: 26.1 mmol/L (ref 20.0–28.0)
DRAWN BY: 23703
FIO2: 21
O2 Saturation: 89.5 %
PATIENT TEMPERATURE: 98.6
PH ART: 7.366 (ref 7.350–7.450)
pCO2 arterial: 46.6 mmHg (ref 32.0–48.0)
pO2, Arterial: 66.4 mmHg — ABNORMAL LOW (ref 83.0–108.0)

## 2018-05-27 LAB — CULTURE, BODY FLUID-BOTTLE: CULTURE: NO GROWTH

## 2018-05-27 LAB — SURGICAL PCR SCREEN
MRSA, PCR: NEGATIVE
Staphylococcus aureus: NEGATIVE

## 2018-05-27 LAB — GLUCOSE, CAPILLARY
GLUCOSE-CAPILLARY: 133 mg/dL — AB (ref 70–99)
Glucose-Capillary: 129 mg/dL — ABNORMAL HIGH (ref 70–99)

## 2018-05-27 SURGERY — VIDEO ASSISTED THORACOSCOPY (VATS)/EMPYEMA
Anesthesia: General | Site: Chest | Laterality: Left

## 2018-05-27 MED ORDER — LACTATED RINGERS IV SOLN: INTRAVENOUS | Status: DC

## 2018-05-27 MED ORDER — ACETAMINOPHEN 500 MG PO TABS
1000.0000 mg | ORAL_TABLET | Freq: Four times a day (QID) | ORAL | Status: DC
  Administered 2018-05-28 – 2018-05-31 (×10): 1000 mg via ORAL
  Filled 2018-05-27 (×10): qty 2

## 2018-05-27 MED ORDER — PROMETHAZINE HCL 25 MG/ML IJ SOLN: 6.2500 mg | INTRAMUSCULAR | Status: DC | PRN

## 2018-05-27 MED ORDER — HYDROMORPHONE HCL 1 MG/ML IJ SOLN
INTRAMUSCULAR | Status: AC
  Filled 2018-05-27: qty 1

## 2018-05-27 MED ORDER — DIPHENHYDRAMINE HCL 12.5 MG/5ML PO ELIX: 12.5000 mg | ORAL_SOLUTION | Freq: Four times a day (QID) | ORAL | Status: DC | PRN

## 2018-05-27 MED ORDER — PROPOFOL 10 MG/ML IV BOLUS
INTRAVENOUS | Status: AC
  Filled 2018-05-27: qty 40

## 2018-05-27 MED ORDER — PHENYLEPHRINE HCL 10 MG/ML IJ SOLN
INTRAMUSCULAR | Status: DC | PRN
  Administered 2018-05-27: 80 ug via INTRAVENOUS

## 2018-05-27 MED ORDER — BUPIVACAINE 0.5 % ON-Q PUMP SINGLE CATH 400 ML
INJECTION | Status: AC | PRN
  Administered 2018-05-27: 400 mL

## 2018-05-27 MED ORDER — KETOROLAC TROMETHAMINE 15 MG/ML IJ SOLN
15.0000 mg | Freq: Four times a day (QID) | INTRAMUSCULAR | Status: DC | PRN
  Administered 2018-05-27: 15 mg via INTRAVENOUS
  Filled 2018-05-27: qty 1

## 2018-05-27 MED ORDER — DEXAMETHASONE SODIUM PHOSPHATE 10 MG/ML IJ SOLN
INTRAMUSCULAR | Status: AC
  Filled 2018-05-27: qty 1

## 2018-05-27 MED ORDER — PROPOFOL 10 MG/ML IV BOLUS
INTRAVENOUS | Status: DC | PRN
  Administered 2018-05-27: 150 mg via INTRAVENOUS

## 2018-05-27 MED ORDER — DIPHENHYDRAMINE HCL 50 MG/ML IJ SOLN: 12.5000 mg | Freq: Four times a day (QID) | INTRAMUSCULAR | Status: DC | PRN

## 2018-05-27 MED ORDER — ACETAMINOPHEN 160 MG/5ML PO SOLN
1000.0000 mg | Freq: Four times a day (QID) | ORAL | Status: DC
  Administered 2018-05-31: 1000 mg via ORAL
  Filled 2018-05-27: qty 40.6

## 2018-05-27 MED ORDER — ONDANSETRON HCL 4 MG/2ML IJ SOLN: 4.0000 mg | Freq: Four times a day (QID) | INTRAMUSCULAR | Status: DC | PRN

## 2018-05-27 MED ORDER — ONDANSETRON HCL 4 MG/2ML IJ SOLN
INTRAMUSCULAR | Status: DC | PRN
Start: 2018-05-27 — End: 2018-05-27
  Administered 2018-05-27: 4 mg via INTRAVENOUS

## 2018-05-27 MED ORDER — MIDAZOLAM HCL 5 MG/5ML IJ SOLN
INTRAMUSCULAR | Status: DC | PRN
  Administered 2018-05-27: 2 mg via INTRAVENOUS

## 2018-05-27 MED ORDER — LIDOCAINE 2% (20 MG/ML) 5 ML SYRINGE
INTRAMUSCULAR | Status: AC
  Filled 2018-05-27: qty 5

## 2018-05-27 MED ORDER — MIDAZOLAM HCL 2 MG/2ML IJ SOLN
INTRAMUSCULAR | Status: AC
  Filled 2018-05-27: qty 2

## 2018-05-27 MED ORDER — TRAMADOL HCL 50 MG PO TABS: 50.0000 mg | ORAL_TABLET | Freq: Four times a day (QID) | ORAL | Status: DC | PRN

## 2018-05-27 MED ORDER — ESMOLOL HCL 100 MG/10ML IV SOLN
INTRAVENOUS | Status: DC | PRN
  Administered 2018-05-27: 30 mg via INTRAVENOUS

## 2018-05-27 MED ORDER — 0.9 % SODIUM CHLORIDE (POUR BTL) OPTIME
TOPICAL | Status: DC | PRN
  Administered 2018-05-27: 2000 mL

## 2018-05-27 MED ORDER — FENTANYL CITRATE (PF) 250 MCG/5ML IJ SOLN
INTRAMUSCULAR | Status: AC
  Filled 2018-05-27: qty 5

## 2018-05-27 MED ORDER — DEXTROSE-NACL 5-0.45 % IV SOLN
INTRAVENOUS | Status: DC
  Administered 2018-05-27 – 2018-05-30 (×4): via INTRAVENOUS

## 2018-05-27 MED ORDER — ROCURONIUM BROMIDE 100 MG/10ML IV SOLN
INTRAVENOUS | Status: DC | PRN
  Administered 2018-05-27: 60 mg via INTRAVENOUS
  Administered 2018-05-27: 20 mg via INTRAVENOUS

## 2018-05-27 MED ORDER — SENNOSIDES-DOCUSATE SODIUM 8.6-50 MG PO TABS
1.0000 | ORAL_TABLET | Freq: Every day | ORAL | Status: DC
  Administered 2018-05-28 – 2018-05-30 (×3): 1 via ORAL
  Filled 2018-05-27 (×3): qty 1

## 2018-05-27 MED ORDER — OXYCODONE HCL 5 MG PO TABS: 5.0000 mg | ORAL_TABLET | ORAL | Status: DC | PRN

## 2018-05-27 MED ORDER — SUGAMMADEX SODIUM 200 MG/2ML IV SOLN
INTRAVENOUS | Status: DC | PRN
  Administered 2018-05-27: 200 mg via INTRAVENOUS

## 2018-05-27 MED ORDER — VANCOMYCIN HCL 10 G IV SOLR
1250.0000 mg | Freq: Two times a day (BID) | INTRAVENOUS | Status: DC
  Administered 2018-05-27 – 2018-05-30 (×6): 1250 mg via INTRAVENOUS
  Filled 2018-05-27 (×6): qty 1250

## 2018-05-27 MED ORDER — ONDANSETRON HCL 4 MG/2ML IJ SOLN
INTRAMUSCULAR | Status: AC
  Filled 2018-05-27: qty 2

## 2018-05-27 MED ORDER — LABETALOL HCL 5 MG/ML IV SOLN
INTRAVENOUS | Status: DC | PRN
  Administered 2018-05-27: 5 mg via INTRAVENOUS

## 2018-05-27 MED ORDER — SODIUM CHLORIDE 0.9% FLUSH: 9.0000 mL | INTRAVENOUS | Status: DC | PRN

## 2018-05-27 MED ORDER — NALOXONE HCL 0.4 MG/ML IJ SOLN
0.4000 mg | INTRAMUSCULAR | Status: DC | PRN
  Filled 2018-05-27: qty 1

## 2018-05-27 MED ORDER — POTASSIUM CHLORIDE 10 MEQ/50ML IV SOLN: 10.0000 meq | Freq: Every day | INTRAVENOUS | Status: DC | PRN

## 2018-05-27 MED ORDER — PROPOFOL 10 MG/ML IV BOLUS
INTRAVENOUS | Status: AC
  Filled 2018-05-27: qty 20

## 2018-05-27 MED ORDER — BISACODYL 5 MG PO TBEC
10.0000 mg | DELAYED_RELEASE_TABLET | Freq: Every day | ORAL | Status: DC
  Administered 2018-05-28 – 2018-05-29 (×2): 10 mg via ORAL
  Filled 2018-05-27 (×3): qty 2

## 2018-05-27 MED ORDER — FENTANYL CITRATE (PF) 100 MCG/2ML IJ SOLN
INTRAMUSCULAR | Status: DC | PRN
  Administered 2018-05-27 (×3): 50 ug via INTRAVENOUS
  Administered 2018-05-27: 150 ug via INTRAVENOUS
  Administered 2018-05-27: 50 ug via INTRAVENOUS
  Administered 2018-05-27: 100 ug via INTRAVENOUS
  Administered 2018-05-27: 50 ug via INTRAVENOUS

## 2018-05-27 MED ORDER — ROCURONIUM BROMIDE 10 MG/ML (PF) SYRINGE
PREFILLED_SYRINGE | INTRAVENOUS | Status: AC
  Filled 2018-05-27: qty 10

## 2018-05-27 MED ORDER — FENTANYL CITRATE (PF) 100 MCG/2ML IJ SOLN: 25.0000 ug | INTRAMUSCULAR | Status: DC | PRN

## 2018-05-27 MED ORDER — SUGAMMADEX SODIUM 200 MG/2ML IV SOLN
INTRAVENOUS | Status: AC
  Filled 2018-05-27: qty 2

## 2018-05-27 MED ORDER — BUPIVACAINE HCL 0.5 % IJ SOLN
INTRAMUSCULAR | Status: AC
  Filled 2018-05-27: qty 1

## 2018-05-27 MED ORDER — DEXAMETHASONE SODIUM PHOSPHATE 4 MG/ML IJ SOLN
INTRAMUSCULAR | Status: DC | PRN
  Administered 2018-05-27: 5 mg via INTRAVENOUS

## 2018-05-27 MED ORDER — HYDROMORPHONE HCL 1 MG/ML IJ SOLN
0.2500 mg | INTRAMUSCULAR | Status: DC | PRN
  Administered 2018-05-27 (×4): 0.5 mg via INTRAVENOUS

## 2018-05-27 MED ORDER — LACTATED RINGERS IV SOLN
INTRAVENOUS | Status: DC
  Administered 2018-05-27 (×2): via INTRAVENOUS

## 2018-05-27 MED ORDER — INSULIN ASPART 100 UNIT/ML ~~LOC~~ SOLN
0.0000 [IU] | SUBCUTANEOUS | Status: DC
  Administered 2018-05-27 – 2018-05-28 (×3): 2 [IU] via SUBCUTANEOUS

## 2018-05-27 MED ORDER — FENTANYL 40 MCG/ML IV SOLN
INTRAVENOUS | Status: DC
  Administered 2018-05-27: 1000 ug via INTRAVENOUS
  Administered 2018-05-28: 45 ug via INTRAVENOUS
  Administered 2018-05-28: 120 ug via INTRAVENOUS
  Administered 2018-05-28: 135 ug via INTRAVENOUS
  Administered 2018-05-28: 180 ug via INTRAVENOUS
  Administered 2018-05-28: 120 ug via INTRAVENOUS
  Administered 2018-05-28: 90 ug via INTRAVENOUS
  Administered 2018-05-29: 45 ug via INTRAVENOUS
  Administered 2018-05-29: 60 ug via INTRAVENOUS
  Administered 2018-05-29: 105 ug via INTRAVENOUS
  Administered 2018-05-29: 45 ug via INTRAVENOUS
  Administered 2018-05-29: 0 ug via INTRAVENOUS
  Administered 2018-05-29: 02:00:00 via INTRAVENOUS
  Administered 2018-05-29: 45 ug via INTRAVENOUS
  Administered 2018-05-30: 30 ug via INTRAVENOUS
  Administered 2018-05-30: 60 ug via INTRAVENOUS
  Filled 2018-05-27 (×2): qty 25

## 2018-05-27 MED ORDER — BUPIVACAINE HCL 0.5 % IJ SOLN
INTRAMUSCULAR | Status: DC | PRN
  Administered 2018-05-27: 50 mL

## 2018-05-27 MED ORDER — BUPIVACAINE 0.5 % ON-Q PUMP SINGLE CATH 400 ML
400.0000 mL | INJECTION | Status: AC
  Filled 2018-05-27: qty 400

## 2018-05-27 MED ORDER — LIDOCAINE HCL (CARDIAC) PF 100 MG/5ML IV SOSY
PREFILLED_SYRINGE | INTRAVENOUS | Status: DC | PRN
  Administered 2018-05-27: 60 mg via INTRAVENOUS

## 2018-05-27 SURGICAL SUPPLY — 73 items
ADH SKN CLS APL DERMABOND .7 (GAUZE/BANDAGES/DRESSINGS)
BAG DECANTER FOR FLEXI CONT (MISCELLANEOUS) IMPLANT
BLADE SURG 11 STRL SS (BLADE) ×1 IMPLANT
CANISTER SUCT 3000ML PPV (MISCELLANEOUS) ×3 IMPLANT
CATH KIT ON Q 5IN SLV (PAIN MANAGEMENT) IMPLANT
CATH KIT ON-Q SILVERSOAK 5 (CATHETERS) IMPLANT
CATH KIT ON-Q SILVERSOAK 5IN (CATHETERS) ×3 IMPLANT
CATH ROBINSON RED A/P 22FR (CATHETERS) IMPLANT
CATH THORACIC 28FR (CATHETERS) ×2 IMPLANT
CATH THORACIC 36FR (CATHETERS) IMPLANT
CATH THORACIC 36FR RT ANG (CATHETERS) IMPLANT
CONN ST 1/4X3/8  BEN (MISCELLANEOUS) ×2
CONN ST 1/4X3/8 BEN (MISCELLANEOUS) IMPLANT
CONT SPEC 4OZ CLIKSEAL STRL BL (MISCELLANEOUS) ×6 IMPLANT
DERMABOND ADVANCED (GAUZE/BANDAGES/DRESSINGS)
DERMABOND ADVANCED .7 DNX12 (GAUZE/BANDAGES/DRESSINGS) IMPLANT
DRAIN CHANNEL 32F RND 10.7 FF (WOUND CARE) ×2 IMPLANT
DRAPE LAPAROSCOPIC ABDOMINAL (DRAPES) ×3 IMPLANT
DRAPE SLUSH/WARMER DISC (DRAPES) ×2 IMPLANT
DRAPE WARM FLUID 44X44 (DRAPE) ×1 IMPLANT
ELECT BLADE 4.0 EZ CLEAN MEGAD (MISCELLANEOUS) ×3
ELECT BLADE 6.5 EXT (BLADE) ×2 IMPLANT
ELECT REM PT RETURN 9FT ADLT (ELECTROSURGICAL) ×3
ELECTRODE BLDE 4.0 EZ CLN MEGD (MISCELLANEOUS) IMPLANT
ELECTRODE REM PT RTRN 9FT ADLT (ELECTROSURGICAL) ×1 IMPLANT
GAUZE SPONGE 4X4 12PLY STRL (GAUZE/BANDAGES/DRESSINGS) ×3 IMPLANT
GLOVE BIOGEL M 6.5 STRL (GLOVE) ×8 IMPLANT
GLOVE SURG SS PI 6.0 STRL IVOR (GLOVE) ×2 IMPLANT
GOWN STRL REUS W/ TWL LRG LVL3 (GOWN DISPOSABLE) ×3 IMPLANT
GOWN STRL REUS W/TWL LRG LVL3 (GOWN DISPOSABLE) ×9
KIT BASIN OR (CUSTOM PROCEDURE TRAY) ×3 IMPLANT
KIT SUCTION CATH 14FR (SUCTIONS) ×1 IMPLANT
KIT TURNOVER KIT B (KITS) ×3 IMPLANT
NS IRRIG 1000ML POUR BTL (IV SOLUTION) ×8 IMPLANT
PACK CHEST (CUSTOM PROCEDURE TRAY) ×3 IMPLANT
PAD ARMBOARD 7.5X6 YLW CONV (MISCELLANEOUS) ×6 IMPLANT
SEALANT SURG COSEAL 4ML (VASCULAR PRODUCTS) IMPLANT
SOLUTION ANTI FOG 6CC (MISCELLANEOUS) ×3 IMPLANT
SPONGE TONSIL 1.25 RF SGL STRG (GAUZE/BANDAGES/DRESSINGS) ×1 IMPLANT
SPONGE TONSIL TAPE 1 RFD (DISPOSABLE) ×4 IMPLANT
SUT CHROMIC 3 0 SH 27 (SUTURE) IMPLANT
SUT ETHILON 3 0 PS 1 (SUTURE) IMPLANT
SUT PROLENE 3 0 SH DA (SUTURE) IMPLANT
SUT PROLENE 4 0 RB 1 (SUTURE)
SUT PROLENE 4-0 RB1 .5 CRCL 36 (SUTURE) IMPLANT
SUT PROLENE 6 0 C 1 30 (SUTURE) IMPLANT
SUT SILK  1 MH (SUTURE) ×4
SUT SILK 1 MH (SUTURE) ×2 IMPLANT
SUT SILK 1 TIES 10X30 (SUTURE) IMPLANT
SUT SILK 2 0SH CR/8 30 (SUTURE) ×2 IMPLANT
SUT SILK 3 0SH CR/8 30 (SUTURE) IMPLANT
SUT VIC AB 1 CTX 18 (SUTURE) ×4 IMPLANT
SUT VIC AB 1 CTX 36 (SUTURE) ×3
SUT VIC AB 1 CTX36XBRD ANBCTR (SUTURE) IMPLANT
SUT VIC AB 2 TP1 27 (SUTURE) IMPLANT
SUT VIC AB 2-0 CT2 18 VCP726D (SUTURE) IMPLANT
SUT VIC AB 2-0 CTX 36 (SUTURE) ×2 IMPLANT
SUT VIC AB 3-0 SH 18 (SUTURE) IMPLANT
SUT VIC AB 3-0 X1 27 (SUTURE) ×2 IMPLANT
SUT VICRYL 0 UR6 27IN ABS (SUTURE) IMPLANT
SUT VICRYL 2 TP 1 (SUTURE) ×2 IMPLANT
SWAB COLLECTION DEVICE MRSA (MISCELLANEOUS) IMPLANT
SWAB CULTURE ESWAB REG 1ML (MISCELLANEOUS) IMPLANT
SYSTEM SAHARA CHEST DRAIN ATS (WOUND CARE) ×3 IMPLANT
TAPE CLOTH SURG 4X10 WHT LF (GAUZE/BANDAGES/DRESSINGS) ×2 IMPLANT
TIP APPLICATOR SPRAY EXTEND 16 (VASCULAR PRODUCTS) IMPLANT
TOWEL GREEN STERILE (TOWEL DISPOSABLE) ×3 IMPLANT
TOWEL GREEN STERILE FF (TOWEL DISPOSABLE) ×3 IMPLANT
TRAP SPECIMEN MUCOUS 40CC (MISCELLANEOUS) ×4 IMPLANT
TRAY FOLEY MTR SLVR 16FR STAT (SET/KITS/TRAYS/PACK) ×3 IMPLANT
TROCAR XCEL BLADELESS 5X75MML (TROCAR) ×2 IMPLANT
TUNNELER SHEATH ON-Q 11GX8 DSP (PAIN MANAGEMENT) ×2 IMPLANT
WATER STERILE IRR 1000ML POUR (IV SOLUTION) ×6 IMPLANT

## 2018-05-27 NOTE — Progress Notes (Signed)
Pharmacy Antibiotic Note  John Rodriguez is a 52 y.o. male admitted on 05/21/2018 with empyema.  Pharmacy has been consulted for vancomycin dosing. Pt s/p VATS on 8/2, SCr <1 mg/dl.  Plan: -Vancomycin 1250mg  IV q12h -Monitor renal function, cultures, LOT -Check Vancomycin trough at Css  Height: 5\' 7"  (170.2 cm) Weight: 185 lb (83.9 kg) IBW/kg (Calculated) : 66.1  Temp (24hrs), Avg:98.4 F (36.9 C), Min:98.2 F (36.8 C), Max:98.7 F (37.1 C)  Recent Labs  Lab 05/21/18 1532 05/22/18 0148 05/24/18 0231 05/26/18 0230 05/26/18 0822  WBC 9.3 8.9 8.4  --  8.2  CREATININE 0.78 0.64 0.80 0.73 0.84    Estimated Creatinine Clearance: 106.5 mL/min (by C-G formula based on SCr of 0.84 mg/dL).    No Known Allergies  Antimicrobials this admission: Levofloxacin 7/28 >> 7/31 Ceftriaxone 8/1 >> Cefazolin x1 8/2 Vancomycin 8/2 >>  Dose adjustments this admission: none  Microbiology results: 7/28 Pleural Cx: NGTD 8/2 AFB: sent 8/2 Pleural Cx: NGTD   Thank you for allowing pharmacy to be a part of this patient's care.  Fredonia HighlandMichael Bitonti, PharmD, BCPS Clinical Pharmacist [phone] Please check AMION for all [facility] numbers 05/27/2018

## 2018-05-27 NOTE — Brief Op Note (Signed)
05/21/2018 - 05/27/2018  8:34 AM  PATIENT:  John Rodriguez  52 y.o. male  PRE-OPERATIVE DIAGNOSIS:  LEFT EMPYEMA  POST-OPERATIVE DIAGNOSIS:  LEFT EMPYEMA  PROCEDURE:  Procedure(s): VIDEO ASSISTED THORACOSCOPY (VATS)/EMPYEMA (Left)  SURGEON:  Surgeon(s) and Role:    Kerin Perna* Van Trigt, Peter, MD - Primary  PHYSICIAN ASSISTANT:  Jari Favreessa Conte, PA-C   ANESTHESIA:   general  EBL:  75 mL   BLOOD ADMINISTERED:none  DRAINS: ROUTINE   LOCAL MEDICATIONS USED:  OTHER ON-Q pump  SPECIMEN:  Source of Specimen:  pleural peel  DISPOSITION OF SPECIMEN:  PATHOLOGY  COUNTS:  YES  TOURNIQUET:  * No tourniquets in log *  DICTATION: .Dragon Dictation  PLAN OF CARE: Admit to inpatient   PATIENT DISPOSITION:  ICU - intubated and hemodynamically stable.   Delay start of Pharmacological VTE agent (>24hrs) due to surgical blood loss or risk of bleeding: yes

## 2018-05-27 NOTE — Anesthesia Procedure Notes (Addendum)
Procedure Name: Intubation Date/Time: 05/27/2018 3:33 PM Performed by: Oletta Lamas, CRNA Pre-anesthesia Checklist: Patient identified, Emergency Drugs available, Suction available and Patient being monitored Patient Re-evaluated:Patient Re-evaluated prior to induction Oxygen Delivery Method: Circle System Utilized Preoxygenation: Pre-oxygenation with 100% oxygen Induction Type: IV induction Ventilation: Mask ventilation without difficulty Laryngoscope Size: Mac and 4 Grade View: Grade I Tube type: Oral Endobronchial tube: Left and Double lumen EBT and 39 Fr Number of attempts: 1 Airway Equipment and Method: Stylet Placement Confirmation: ETT inserted through vocal cords under direct vision,  positive ETCO2 and breath sounds checked- equal and bilateral Secured at: 29 cm Tube secured with: Tape Dental Injury: Teeth and Oropharynx as per pre-operative assessment  Comments: Tube placement verified with Bronchoscope by MDA

## 2018-05-27 NOTE — Anesthesia Procedure Notes (Signed)
Arterial Line Insertion Start/End8/11/2017 3:35 PM, 05/27/2018 3:45 PM Performed by: Jodell Ciproato, Sarah A, CRNA, CRNA  Patient location: OR. Preanesthetic checklist: patient identified, IV checked, site marked, risks and benefits discussed, surgical consent, monitors and equipment checked, pre-op evaluation, timeout performed and anesthesia consent Patient sedated Left, radial was placed Catheter size: 20 G Hand hygiene performed , maximum sterile barriers used  and Seldinger technique used Allen's test indicative of satisfactory collateral circulation Attempts: 3 Procedure performed without using ultrasound guided technique. Following insertion, dressing applied and Biopatch. Post procedure complications: local hematoma. Patient tolerated the procedure well with no immediate complications.

## 2018-05-27 NOTE — Anesthesia Procedure Notes (Signed)
Central Venous Catheter Insertion Performed by: Eilene Ghaziose, Johntavious Francom, MD, anesthesiologist Start/End8/11/2017 2:57 PM, 05/27/2018 3:13 PM Patient location: Pre-op. Preanesthetic checklist: patient identified, IV checked, site marked, risks and benefits discussed, surgical consent, monitors and equipment checked, pre-op evaluation, timeout performed and anesthesia consent Position: Trendelenburg Lidocaine 1% used for infiltration and patient sedated Hand hygiene performed , maximum sterile barriers used  and Seldinger technique used Catheter size: 8 Fr Total catheter length 16. Central line was placed.Double lumen Procedure performed using ultrasound guided technique. Ultrasound Notes:anatomy identified, needle tip was noted to be adjacent to the nerve/plexus identified, no ultrasound evidence of intravascular and/or intraneural injection and image(s) printed for medical record Attempts: 1 Following insertion, dressing applied, line sutured and Biopatch. Post procedure assessment: blood return through all ports  Patient tolerated the procedure well with no immediate complications.

## 2018-05-27 NOTE — Transfer of Care (Signed)
Immediate Anesthesia Transfer of Care Note  Patient: John Rodriguez  Procedure(s) Performed: VIDEO ASSISTED THORACOSCOPY (VATS)/EMPYEMA (Left Chest)  Patient Location: PACU  Anesthesia Type:General  Level of Consciousness: awake and drowsy  Airway & Oxygen Therapy: Patient Spontanous Breathing and Patient connected to nasal cannula oxygen  Post-op Assessment: Report given to RN and Post -op Vital signs reviewed and stable  Post vital signs: Reviewed and stable  Last Vitals:  Vitals Value Taken Time  BP    Temp    Pulse 87 05/27/2018  5:54 PM  Resp 20 05/27/2018  5:54 PM  SpO2 97 % 05/27/2018  5:54 PM  Vitals shown include unvalidated device data.  Last Pain:  Vitals:   05/27/18 1333  TempSrc:   PainSc: 5       Patients Stated Pain Goal: 0 (05/27/18 1333)  Complications: No apparent anesthesia complications

## 2018-05-27 NOTE — Anesthesia Procedure Notes (Signed)
Anesthesia Procedure Image    

## 2018-05-27 NOTE — Progress Notes (Signed)
Pre Procedure note for inpatients:   Rockney Clancy GourdM Zuleta has been scheduled for Procedure(s): VIDEO ASSISTED THORACOSCOPY (VATS)/EMPYEMA (Left) today. The various methods of treatment have been discussed with the patient. After consideration of the risks, benefits and treatment options the patient has consented to the planned procedure.   The patient has been seen and labs reviewed. There are no changes in the patient's condition to prevent proceeding with the planned procedure today.  Recent labs:  Lab Results  Component Value Date   WBC 8.2 05/26/2018   HGB 14.5 05/26/2018   HCT 45.4 05/26/2018   PLT 308 05/26/2018   GLUCOSE 137 (H) 05/26/2018   ALT 46 (H) 05/26/2018   AST 35 05/26/2018   NA 140 05/26/2018   K 4.2 05/26/2018   CL 101 05/26/2018   CREATININE 0.84 05/26/2018   BUN 12 05/26/2018   CO2 28 05/26/2018   TSH 0.946 05/22/2018   INR 1.11 05/26/2018   HGBA1C 5.6 05/26/2018    Mikey BussingPeter Van Trigt III, MD 05/27/2018 2:47 PM

## 2018-05-27 NOTE — Anesthesia Preprocedure Evaluation (Signed)
Anesthesia Evaluation  Patient identified by MRN, date of birth, ID band Patient awake    Reviewed: Allergy & Precautions, NPO status , Patient's Chart, lab work & pertinent test results  Airway Mallampati: II  TM Distance: >3 FB Neck ROM: Full    Dental no notable dental hx.    Pulmonary pneumonia, unresolved, Current Smoker,    breath sounds clear to auscultation + decreased breath sounds      Cardiovascular Normal cardiovascular exam Rhythm:Regular Rate:Normal  Left ventricle:  The cavity size was normal. Systolic function was normal. The estimated ejection fraction was in the range of 55% to 60%. Wall motion was normal; there were no regional wall motion abnormalities. The transmitral flow pattern was normal. The deceleration time of the early transmitral flow velocity was normal. The pulmonary vein flow pattern was normal. The tissue Doppler parameters were normal. Left ventricular diastolic function parameters were normal.  ------------------------------------------------------------------- Aortic valve:   Trileaflet; mildly thickened, mildly calcified leaflets. Mobility was not restricted.  Doppler:  Transvalvular velocity was within the normal range. There was no stenosis. There was no regurgitation.  ------------------------------------------------------------------- Aorta:  Aortic root: The aortic root was normal in size.  ------------------------------------------------------------------- Mitral valve:   Structurally normal valve.   Mobility was not restricted.  Doppler:  Transvalvular velocity was within the normal range. There was no evidence for stenosis. There was no regurgitation.  ------------------------------------------------------------------- Left atrium:  The atrium was normal in size.  ------------------------------------------------------------------- Right ventricle:  The cavity size was  normal. Wall thickness was normal. Systolic function was normal.  ------------------------------------------------------------------- Ventricular septum:   Septal motion showed paradox.  ------------------------------------------------------------------- Pulmonic valve:    Structurally normal valve.   Cusp separation was normal.  Doppler:  Transvalvular velocity was within the normal range. There was no evidence for stenosis. There was no regurgitation.  ------------------------------------------------------------------- Tricuspid valve:   Structurally normal valve.    Doppler: Transvalvular velocity was within the normal range. There was no regurgitation.   Neuro/Psych negative neurological ROS  negative psych ROS   GI/Hepatic negative GI ROS, Neg liver ROS,   Endo/Other  negative endocrine ROS  Renal/GU negative Renal ROS  negative genitourinary   Musculoskeletal negative musculoskeletal ROS (+)   Abdominal   Peds negative pediatric ROS (+)  Hematology negative hematology ROS (+)   Anesthesia Other Findings   Reproductive/Obstetrics negative OB ROS                             Anesthesia Physical Anesthesia Plan  ASA: III  Anesthesia Plan: General   Post-op Pain Management:    Induction: Intravenous  PONV Risk Score and Plan: 2 and Ondansetron, Dexamethasone and Treatment may vary due to age or medical condition  Airway Management Planned: Double Lumen EBT  Additional Equipment: Arterial line, CVP and Ultrasound Guidance Line Placement  Intra-op Plan:   Post-operative Plan: Extubation in OR  Informed Consent: I have reviewed the patients History and Physical, chart, labs and discussed the procedure including the risks, benefits and alternatives for the proposed anesthesia with the patient or authorized representative who has indicated his/her understanding and acceptance.   Dental advisory given  Plan Discussed with: CRNA  and Surgeon  Anesthesia Plan Comments:         Anesthesia Quick Evaluation

## 2018-05-27 NOTE — Progress Notes (Signed)
PROGRESS NOTE    John Rodriguez  ZOX:[id number] DOB: [birth year] DOA: 05/21/2018 PCP: Lavinia SharpsPlacey, Mary Ann, NP    Brief Narrative:  52 year old male who presented with chest pain. He does have significant past medical history of tobacco abuse. Reported 1 week history of chest pain at the left side nonexertional related, no improving or worsening factors. No cough or fever.His initial physical examination blood pressure 158/94, heart rate 91, respiratory 21, oxygen saturation 95%. His lungs had decreased breath sounds at the left base, no wheezing, rales or rhonchi, heart S1-S2 present rhythmic, abdomen soft nontender, no lower extremity edema.His chest x-ray showed left pleural effusion.  Patient was admitted to the hospital working diagnosis of left pleural effusion.   Assessment & Plan:   Principal Problem:   Pleural effusion on left Active Problems:   Tobacco abuse   S/P thoracentesis  1. Left exudate pleural effusion/ possible parapneumonic effusion, present on admission. IV Ceftriaxone for antibiotic therapy,continue pain control with IV ketorolac while NPO. Scheduled for VATS today.   2. Thyroid nodule. results from biopsy with benign follicular nodule.    3. HTN. Blood pressure control with lisinopril.    DVT prophylaxis:enoxaparin Code Status:full Family Communication:no family at the bedside Disposition Plan/ discharge barriers:Pending results from VATS  Consultants:  Pulmonary  Procedures:    Antimicrobials   Subjective: Patient is feeling well, has persistent chest pain on the left, no dyspnea or cough, no fever or chills, no nausea or vomiting. Has been NPO for procedure this am.   Objective: Vitals:   05/26/18 0803 05/26/18 1642 05/26/18 2326 05/27/18 0733  BP: (!) 143/101 124/81 (!) 147/76 (!) 149/83  Pulse: 74 92 73 83  Resp: 20 20 14 18   Temp: 98.2 F (36.8 C) 98.7 F (37.1 C) 98.7 F (37.1 C) 98.2 F (36.8 C)    TempSrc: Oral Oral Oral Oral  SpO2: 97% 100% 97% 92%  Weight:      Height:        Intake/Output Summary (Last 24 hours) at 05/27/2018 0914 Last data filed at 05/26/2018 1700 Gross per 24 hour  Intake 480 ml  Output -  Net 480 ml   Filed Weights   05/21/18 1525  Weight: 83.9 kg (185 lb)    Examination:   General: Not in pain or dyspnea Neurology: Awake and alert, non focal  E ENT: no pallor, no icterus, oral mucosa moist Cardiovascular: No JVD. S1-S2 present, rhythmic, no gallops, rubs, or murmurs. No lower extremity edema. Pulmonary: decreased breath sounds at the left base, no wheezing, rhonchi or rales. Gastrointestinal. Abdomen flat, no organomegaly, non tender, no rebound or guarding Skin. No rashes Musculoskeletal: no joint deformities     Data Reviewed: I have personally reviewed following labs and imaging studies  CBC: Recent Labs  Lab 05/21/18 1532 05/22/18 0148 05/24/18 0231 05/26/18 0822  WBC 9.3 8.9 8.4 8.2  HGB 15.1 13.9 14.8 14.5  HCT 47.9 42.9 46.3 45.4  MCV 97.6 95.3 96.5 95.2  PLT 257 219 291 308   Basic Metabolic Panel: Recent Labs  Lab 05/21/18 1532 05/22/18 0148 05/24/18 0231 05/26/18 0230 05/26/18 0822  NA 138 136 139 139 140  K 4.4 3.9 4.3 4.1 4.2  CL 99 98 100 102 101  CO2 30 27 29 27 28   GLUCOSE 127* 139* 99 108* 137*  BUN 11 7 8 11 12   CREATININE 0.78 0.64 0.80 0.73 0.84  CALCIUM 9.5 9.0 9.1 8.9 9.4   GFR: Estimated Creatinine Clearance:  106.5 mL/min (by C-G formula based on SCr of 0.84 mg/dL). Liver Function Tests: Recent Labs  Lab 05/22/18 0148 05/26/18 0230 05/26/18 0822  AST 20 38 35  ALT 22 45* 46*  ALKPHOS 46 67 67  BILITOT 0.7 0.7 0.7  PROT 6.7 6.5 6.9  ALBUMIN 3.1* 2.7* 3.0*   No results for input(s): LIPASE, AMYLASE in the last 168 hours. No results for input(s): AMMONIA in the last 168 hours. Coagulation Profile: Recent Labs  Lab 05/26/18 0230 05/26/18 0822  INR 1.08 1.11   Cardiac Enzymes: Recent  Labs  Lab 05/22/18 0148 05/22/18 0816 05/22/18 1449  TROPONINI <0.03 <0.03 <0.03   BNP (last 3 results) No results for input(s): PROBNP in the last 8760 hours. HbA1C: Recent Labs    05/26/18 0230  HGBA1C 5.6   CBG: No results for input(s): GLUCAP in the last 168 hours. Lipid Profile: No results for input(s): CHOL, HDL, LDLCALC, TRIG, CHOLHDL, LDLDIRECT in the last 72 hours. Thyroid Function Tests: No results for input(s): TSH, T4TOTAL, FREET4, T3FREE, THYROIDAB in the last 72 hours. Anemia Panel: No results for input(s): VITAMINB12, FOLATE, FERRITIN, TIBC, IRON, RETICCTPCT in the last 72 hours.    Radiology Studies: I have reviewed all of the imaging during this hospital visit personally     Scheduled Meds: . enoxaparin (LOVENOX) injection  40 mg Subcutaneous Q24H  . feeding supplement (ENSURE ENLIVE)  237 mL Oral TID WC  . lisinopril  10 mg Oral Daily   Continuous Infusions: .  ceFAZolin (ANCEF) IV    . cefTRIAXone (ROCEPHIN)  IV 1 g (05/27/18 0424)     LOS: 4 days        Tamieka Rancourt Annett Gula, MD [facility] Pager [phone]

## 2018-05-28 ENCOUNTER — Inpatient Hospital Stay (HOSPITAL_COMMUNITY): Payer: Self-pay

## 2018-05-28 LAB — CBC
HCT: 43 % (ref 39.0–52.0)
Hemoglobin: 14 g/dL (ref 13.0–17.0)
MCH: 31.2 pg (ref 26.0–34.0)
MCHC: 32.6 g/dL (ref 30.0–36.0)
MCV: 95.8 fL (ref 78.0–100.0)
PLATELETS: 387 10*3/uL (ref 150–400)
RBC: 4.49 MIL/uL (ref 4.22–5.81)
RDW: 13.1 % (ref 11.5–15.5)
WBC: 14.3 10*3/uL — AB (ref 4.0–10.5)

## 2018-05-28 LAB — BASIC METABOLIC PANEL
ANION GAP: 11 (ref 5–15)
BUN: 11 mg/dL (ref 6–20)
CALCIUM: 8.6 mg/dL — AB (ref 8.9–10.3)
CO2: 27 mmol/L (ref 22–32)
Chloride: 99 mmol/L (ref 98–111)
Creatinine, Ser: 0.66 mg/dL (ref 0.61–1.24)
GLUCOSE: 122 mg/dL — AB (ref 70–99)
Potassium: 4.4 mmol/L (ref 3.5–5.1)
Sodium: 137 mmol/L (ref 135–145)

## 2018-05-28 LAB — ACID FAST SMEAR (AFB, MYCOBACTERIA)
Acid Fast Smear: NEGATIVE
Acid Fast Smear: NEGATIVE

## 2018-05-28 LAB — POCT I-STAT 3, ART BLOOD GAS (G3+)
Acid-Base Excess: 6 mmol/L — ABNORMAL HIGH (ref 0.0–2.0)
Bicarbonate: 32 mmol/L — ABNORMAL HIGH (ref 20.0–28.0)
O2 SAT: 98 %
TCO2: 33 mmol/L — AB (ref 22–32)
pCO2 arterial: 50.8 mmHg — ABNORMAL HIGH (ref 32.0–48.0)
pH, Arterial: 7.407 (ref 7.350–7.450)
pO2, Arterial: 110 mmHg — ABNORMAL HIGH (ref 83.0–108.0)

## 2018-05-28 LAB — GLUCOSE, CAPILLARY
GLUCOSE-CAPILLARY: 130 mg/dL — AB (ref 70–99)
Glucose-Capillary: 118 mg/dL — ABNORMAL HIGH (ref 70–99)
Glucose-Capillary: 129 mg/dL — ABNORMAL HIGH (ref 70–99)

## 2018-05-28 MED ORDER — METOPROLOL TARTRATE 25 MG PO TABS
25.0000 mg | ORAL_TABLET | Freq: Two times a day (BID) | ORAL | Status: DC
  Administered 2018-05-28 – 2018-05-29 (×4): 25 mg via ORAL
  Filled 2018-05-28 (×4): qty 1

## 2018-05-28 MED ORDER — ORAL CARE MOUTH RINSE
15.0000 mL | Freq: Two times a day (BID) | OROMUCOSAL | Status: DC
  Administered 2018-05-28 – 2018-05-30 (×4): 15 mL via OROMUCOSAL

## 2018-05-28 MED ORDER — SODIUM CHLORIDE 0.9 % IV SOLN
2.0000 g | INTRAVENOUS | Status: DC
  Administered 2018-05-29 – 2018-05-31 (×3): 2 g via INTRAVENOUS
  Filled 2018-05-28 (×3): qty 20

## 2018-05-28 MED ORDER — LUNG SURGERY BOOK
Freq: Once | Status: AC
  Administered 2018-05-28: 10:00:00
  Filled 2018-05-28: qty 1

## 2018-05-28 NOTE — Progress Notes (Signed)
Patient ID: John Rodriguez, male   DOB: 1966-04-22, 52 y.o.   MRN: [id number] T[facility] Evening Rounds:  Hemodynamically stable in sinus rhythm. sats 95% Pain under control. Urine output ok CT output low.

## 2018-05-28 NOTE — Progress Notes (Addendum)
PROGRESS NOTE    John Rodriguez  ZOX:[id number] DOB: 11-05-1965 DOA: 05/21/2018 PCP: Lavinia Sharps, NP    Brief Narrative:  52 year old male who presented with chest pain. He does have significant past medical history of tobacco abuse. Reported 1 week history of chest pain at the left side nonexertional related, no improving or worsening factors. No cough or fever.His initial physical examination blood pressure 158/94, heart rate 91, respiratory 21, oxygen saturation 95%. His lungs had decreased breath sounds at the left base, no wheezing, rales or rhonchi, heart S1-S2 present rhythmic, abdomen soft nontender, no lower extremity edema.His chest x-ray showed left pleural effusion.  Patient was admitted to the hospital working diagnosis of left pleural effusion.   Assessment & Plan:   Principal Problem:   Pleural effusion on left Active Problems:   Tobacco abuse   S/P thoracentesis   Empyema of pleural space (HCC)  1. Left exudate pleural effusion/ possible parapneumonic effusion, present on admission. sp VATS large amounts of fibrinous exudate and glue like material in the pleural space, multiple pockets of fluid were opened and drained, about one liter obtained.  Chest tube in place with no air leak, pain is well controlled (fentanyl PCA), oxycodone and tramadol, continue radiographic surveillance. Antibiotic therapy has been escalated to Vancomycin and continue IV ceftriaxone. Continue to follow cell count, cultures and temperature curve. Pleural cultures no growth today. Fluids drain over last 24 hours 241 ml. Personally reviewed chest film with 2 left chest tubes, adequate lung expansion and improvement of effusion, right IJ central venous catheter in place.   2. Thyroid nodule/ benign follicular nodule.Patient euthyroid.   3. HTN. Holding lisinopril to prevent hypotension.    DVT prophylaxis:enoxaparin Code Status:full Family Communication:no family at  the bedside Disposition Plan/ discharge barriers:Pending results from VATS  Consultants:  Pulmonary  Procedures:    Antimicrobials   Subjective: Chest pain is controlled with analgesics, chest tube is in place, no nausea or vomiting, no dyspnea. Out of bed to the chair.   Objective: Vitals:   05/28/18 0750 05/28/18 0800 05/28/18 0900 05/28/18 1000  BP: (!) 143/108 (!) 158/101 (!) 143/103 (!) 140/104  Pulse: 76 97 84 81  Resp: 18 (!) 22 17 16   Temp:      TempSrc:      SpO2: 97% 91% 96% 95%  Weight:      Height:        Intake/Output Summary (Last 24 hours) at 05/28/2018 1033 Last data filed at 05/28/2018 1000 Gross per 24 hour  Intake 3382.87 ml  Output 1721 ml  Net 1661.87 ml   Filed Weights   05/21/18 1525 05/27/18 1417 05/28/18 0600  Weight: 83.9 kg (185 lb) 83.9 kg (185 lb) 79.3 kg (174 lb 12.8 oz)    Examination:   General: Not in pain or dyspnea, Neurology: Awake and alert, non focal  E ENT: no pallor, no icterus, oral mucosa moist Cardiovascular: No JVD. S1-S2 present, rhythmic, no gallops, rubs, or murmurs. No lower extremity edema. Pulmonary: positive breath sounds bilaterally, adequate air movement, no wheezing, rhonchi or rales. Gastrointestinal. Abdomen with no organomegaly, non tender, no rebound or guarding Skin. No rashes Musculoskeletal: no joint deformities  Left chest tube in place, no air leak noted.  Output: 241 ml over last 24 hours.    Data Reviewed: I have personally reviewed following labs and imaging studies  CBC: Recent Labs  Lab 05/21/18 1532 05/22/18 0148 05/24/18 0231 05/26/18 0822 05/28/18 0505  WBC 9.3 8.9  8.4 8.2 14.3*  HGB 15.1 13.9 14.8 14.5 14.0  HCT 47.9 42.9 46.3 45.4 43.0  MCV 97.6 95.3 96.5 95.2 95.8  PLT 257 219 291 308 387   Basic Metabolic Panel: Recent Labs  Lab 05/22/18 0148 05/24/18 0231 05/26/18 0230 05/26/18 0822 05/28/18 0505  NA 136 139 139 140 137  K 3.9 4.3 4.1 4.2 4.4  CL 98 100  102 101 99  CO2 27 29 27 28 27   GLUCOSE 139* 99 108* 137* 122*  BUN 7 8 11 12 11   CREATININE 0.64 0.80 0.73 0.84 0.66  CALCIUM 9.0 9.1 8.9 9.4 8.6*   GFR: Estimated Creatinine Clearance: 101 mL/min (by C-G formula based on SCr of 0.66 mg/dL). Liver Function Tests: Recent Labs  Lab 05/22/18 0148 05/26/18 0230 05/26/18 0822  AST 20 38 35  ALT 22 45* 46*  ALKPHOS 46 67 67  BILITOT 0.7 0.7 0.7  PROT 6.7 6.5 6.9  ALBUMIN 3.1* 2.7* 3.0*   No results for input(s): LIPASE, AMYLASE in the last 168 hours. No results for input(s): AMMONIA in the last 168 hours. Coagulation Profile: Recent Labs  Lab 05/26/18 0230 05/26/18 0822  INR 1.08 1.11   Cardiac Enzymes: Recent Labs  Lab 05/22/18 0148 05/22/18 0816 05/22/18 1449  TROPONINI <0.03 <0.03 <0.03   BNP (last 3 results) No results for input(s): PROBNP in the last 8760 hours. HbA1C: Recent Labs    05/26/18 0230  HGBA1C 5.6   CBG: Recent Labs  Lab 05/27/18 1832 05/27/18 2052 05/27/18 2359 05/28/18 0402 05/28/18 0747  GLUCAP 129* 133* 129* 130* 118*   Lipid Profile: No results for input(s): CHOL, HDL, LDLCALC, TRIG, CHOLHDL, LDLDIRECT in the last 72 hours. Thyroid Function Tests: No results for input(s): TSH, T4TOTAL, FREET4, T3FREE, THYROIDAB in the last 72 hours. Anemia Panel: No results for input(s): VITAMINB12, FOLATE, FERRITIN, TIBC, IRON, RETICCTPCT in the last 72 hours.    Radiology Studies: I have reviewed all of the imaging during this hospital visit personally     Scheduled Meds: . acetaminophen  1,000 mg Oral Q6H   Or  . acetaminophen (TYLENOL) oral liquid 160 mg/5 mL  1,000 mg Oral Q6H  . bisacodyl  10 mg Oral Daily  . fentaNYL   Intravenous Q4H  . insulin aspart  0-24 Units Subcutaneous Q4H  . lung surgery book   Does not apply Once  . mouth rinse  15 mL Mouth Rinse BID  . senna-docusate  1 tablet Oral QHS   Continuous Infusions: . bupivacaine 0.5 % ON-Q pump SINGLE CATH 400 mL    .  cefTRIAXone (ROCEPHIN)  IV Stopped (05/28/18 0443)  . dextrose 5 % and 0.45% NaCl 100 mL/hr at 05/28/18 0844  . potassium chloride    . vancomycin 1,250 mg (05/28/18 0845)     LOS: 5 days        John Rodriguez John Gulaaniel Breyonna Nault, MD [facility] Pager [phone]

## 2018-05-28 NOTE — Progress Notes (Signed)
1 Day Post-Op Procedure(s) (LRB): VIDEO ASSISTED THORACOSCOPY (VATS)/EMPYEMA (Left) Subjective: No complaints  Objective: Vital signs in last 24 hours: Temp:  [97.9 F (36.6 C)-98.9 F (37.2 C)] 98.2 F (36.8 C) (08/03 0720) Pulse Rate:  [71-104] 86 (08/03 1200) Cardiac Rhythm: Normal sinus rhythm (08/03 0800) Resp:  [10-33] 27 (08/03 1200) BP: (136-158)/(87-108) 150/104 (08/03 1200) SpO2:  [91 %-100 %] 95 % (08/03 1200) Arterial Line BP: (123-180)/(85-120) 159/87 (08/03 0600) Weight:  [79.3 kg (174 lb 12.8 oz)-83.9 kg (185 lb)] 79.3 kg (174 lb 12.8 oz) (08/03 0600)  Hemodynamic parameters for last 24 hours:    Intake/Output from previous day: 08/02 0701 - 08/03 0700 In: 3067.3 [I.V.:2717.3; IV Piggyback:350] Out: 1246 [Urine:930; Blood:75; Chest Tube:241] Intake/Output this shift: Total I/O In: 561.2 [P.O.:240; I.V.:321.2] Out: 475 [Urine:475]  General appearance: alert and cooperative Neurologic: intact Heart: regular rate and rhythm, S1, S2 normal, no murmur, click, rub or gallop Lungs: diminished breath sounds left base Extremities: extremities normal, atraumatic, no cyanosis or edema Wound: dressings dry  Lab Results: Recent Labs    05/26/18 0822 05/28/18 0505  WBC 8.2 14.3*  HGB 14.5 14.0  HCT 45.4 43.0  PLT 308 387   BMET:  Recent Labs    05/26/18 0822 05/28/18 0505  NA 140 137  K 4.2 4.4  CL 101 99  CO2 28 27  GLUCOSE 137* 122*  BUN 12 11  CREATININE 0.84 0.66  CALCIUM 9.4 8.6*    PT/INR:  Recent Labs    05/26/18 0822  LABPROT 14.2  INR 1.11   ABG    Component Value Date/Time   PHART 7.407 05/28/2018 0553   HCO3 32.0 (H) 05/28/2018 0553   TCO2 33 (H) 05/28/2018 0553   O2SAT 98.0 05/28/2018 0553   CBG (last 3)  Recent Labs    05/27/18 2359 05/28/18 0402 05/28/18 0747  GLUCAP 129* 130* 118*   CXR: mild left base atelectasis/air space disease.  Assessment/Plan: S/P Procedure(s) (LRB): VIDEO ASSISTED THORACOSCOPY  (VATS)/EMPYEMA (Left)  POD 1  Hemodynamically stable in sinus rhythm. Tendency towards hypertension so will add some Lopressor. Chest tube output low. Continue both tubes to suction. [facility] PCA for pain control. Work on IS Ambulate. Operative cultures pending, GS negative. Continue current antibiotic.   LOS: 5 days    Alleen BorneBryan K Lovada Barwick 05/28/2018

## 2018-05-28 NOTE — Anesthesia Postprocedure Evaluation (Signed)
Anesthesia Post Note  Patient: John Clancy GourdM Abdo  Procedure(s) Performed: VIDEO ASSISTED THORACOSCOPY (VATS)/EMPYEMA (Left Chest)     Patient location during evaluation: PACU Anesthesia Type: General Level of consciousness: awake and alert Pain management: pain level controlled Vital Signs Assessment: post-procedure vital signs reviewed and stable Respiratory status: spontaneous breathing, nonlabored ventilation, respiratory function stable and patient connected to nasal cannula oxygen Cardiovascular status: blood pressure returned to baseline and stable Postop Assessment: no apparent nausea or vomiting Anesthetic complications: no    Last Vitals:  Vitals:   05/28/18 0500 05/28/18 0600  BP:    Pulse: 84 85  Resp: 12 (!) 28  Temp:    SpO2: 96% 97%    Last Pain:  Vitals:   05/28/18 0404  TempSrc: Oral  PainSc:                  Kolby Myung S

## 2018-05-28 NOTE — Op Note (Signed)
NAME: John Rodriguez, John M. MEDICAL RECORD [id number] ACCOUNT [id number] DATE OF BIRTH:[birth year] FACILITY: [facility] LOCATION: [facility] PHYSICIAN:PETER VAN TRIGT III, MD  OPERATIVE REPORT  DATE OF PROCEDURE:  05/27/2018  OPERATION:  Left VATS (video-assisted thoracoscopic surgery) with drainage of left eye empyema and decortication of left lung.  SURGEON:  Mikey BussingPeter Van Trigt III, MD  ASSISTANT:  Jari Favreessa Conte, PA-C   ANESTHESIA:  General.  PREOPERATIVE DIAGNOSIS:  Left lower lobe pneumonia, left empyema, history of smoking.  POSTOPERATIVE DIAGNOSIS:  Left lower lobe pneumonia, left empyema, history of smoking.  CLINICAL NOTE:  The patient is a 52 year old African-American male smoker who presented to the hospital with persistent left chest pain of a pleuritic nature.  He also had low-grade fever, malaise, weight loss and fatigue.  His chest x-ray showed  opacification of the left lower chest, and followup CT scan showed evidence of left lower lobe pneumonia with a loculated effusion.  He was started on antibiotics.  He still had pain.  He underwent a thoracentesis of over 500 mL of fluid.  This was  consistent with a transudate.  Cultures were negative.  Cytology was negative.  The fluid returned almost immediately on followup chest x-ray.  Thoracic surgical evaluation was requested, and I saw the patient in consultation and recommended left VATS  for drainage of empyema and decortication.  I discussed the procedure in detail with the patient including the use of general anesthesia and the location of the surgical incision and the expected postoperative recovery.  I discussed the risks to him of  the procedure including risks of bleeding, prolonged air leak, recurrent infection, and pain.  He demonstrated his understanding and agreed to proceed with surgery under what I felt was an informed consent.  DESCRIPTION OF PROCEDURE:  The patient was placed on the operating room table.  General anesthesia  was induced with a double-lumen endotracheal tube positioned with bronchoscopy assistance.  The patient was turned left side up, and the left chest was  prepped and draped as a sterile field.  A proper time-out was performed.  A small incision was made in the 4th interspace anterior to the latissimus muscle.  A VATS camera was inserted.  The pleural space was obliterated.  The incision was extended, and the left lung was mobilized from the adhesions to the chest wall, diaphragm, and mediastinum.  In mobilizing the lung, multiple pockets of fluid were opened and drained which drained over a liter of fluid.  There was a  large amount of fibrinous exudate and glue-like material in the pleural space.  There was also a peel on the left lower lobe and left upper lobe which was carefully removed to allow reexpansion of the lung.  After tedious dissection removed the material  from the pleural space, the pleural space was irrigated with copious amounts of warm saline.  Hemostasis was adequate.  Two chest tubes were placed anteriorly and posteriorly in the left pleural space and brought out through separate incisions.  Two  pericostal sutures were placed, and before reapproximating the ribs, the lung was reexpanded under direct vision and filled the left pleural space well.  The ribs were reapproximated.  The muscle layers were closed in layers using interrupted Vicryl.   The subcutaneous and skin layers were closed with a running Vicryl.  The chest tubes were connected to Pleur-Evac drainage, and the patient was extubated and returned to the recovery room in stable condition.  LN/NUANCE  D:05/28/2018 T:05/28/2018 JOB:[id number]

## 2018-05-29 ENCOUNTER — Inpatient Hospital Stay (HOSPITAL_COMMUNITY): Payer: Self-pay

## 2018-05-29 ENCOUNTER — Encounter (HOSPITAL_COMMUNITY): Payer: Self-pay | Admitting: Cardiothoracic Surgery

## 2018-05-29 DIAGNOSIS — J869 Pyothorax without fistula: Secondary | ICD-10-CM

## 2018-05-29 DIAGNOSIS — Z720 Tobacco use: Secondary | ICD-10-CM

## 2018-05-29 DIAGNOSIS — E041 Nontoxic single thyroid nodule: Secondary | ICD-10-CM

## 2018-05-29 LAB — BPAM RBC
Blood Product Expiration Date: 201908292359
Blood Product Expiration Date: 201908292359
Unit Type and Rh: 7300
Unit Type and Rh: 7300

## 2018-05-29 LAB — COMPREHENSIVE METABOLIC PANEL
ALT: 40 U/L (ref 0–44)
AST: 38 U/L (ref 15–41)
Albumin: 2.5 g/dL — ABNORMAL LOW (ref 3.5–5.0)
Alkaline Phosphatase: 78 U/L (ref 38–126)
Anion gap: 12 (ref 5–15)
BUN: 12 mg/dL (ref 6–20)
CHLORIDE: 98 mmol/L (ref 98–111)
CO2: 31 mmol/L (ref 22–32)
Calcium: 9 mg/dL (ref 8.9–10.3)
Creatinine, Ser: 0.67 mg/dL (ref 0.61–1.24)
Glucose, Bld: 160 mg/dL — ABNORMAL HIGH (ref 70–99)
POTASSIUM: 4.3 mmol/L (ref 3.5–5.1)
Sodium: 141 mmol/L (ref 135–145)
Total Bilirubin: 0.5 mg/dL (ref 0.3–1.2)
Total Protein: 6.1 g/dL — ABNORMAL LOW (ref 6.5–8.1)

## 2018-05-29 LAB — CBC
HCT: 41.1 % (ref 39.0–52.0)
Hemoglobin: 13.2 g/dL (ref 13.0–17.0)
MCH: 31.1 pg (ref 26.0–34.0)
MCHC: 32.1 g/dL (ref 30.0–36.0)
MCV: 96.7 fL (ref 78.0–100.0)
PLATELETS: 332 10*3/uL (ref 150–400)
RBC: 4.25 MIL/uL (ref 4.22–5.81)
RDW: 13.2 % (ref 11.5–15.5)
WBC: 10 10*3/uL (ref 4.0–10.5)

## 2018-05-29 LAB — TYPE AND SCREEN
ABO/RH(D): B POS
Antibody Screen: NEGATIVE
Unit division: 0
Unit division: 0

## 2018-05-29 NOTE — Progress Notes (Signed)
Patient ID: John RipaScottie M Rodriguez, male   DOB: [birth year], 52 y.o.   MRN: [id number] TCTS Evening Rounds:  Stable day Anterior chest tube removed. Minimal drainage from remaining tube. Urine output ok Operative cultures remain negative.

## 2018-05-29 NOTE — Progress Notes (Signed)
2 Days Post-Op Procedure(s) (LRB): VIDEO ASSISTED THORACOSCOPY (VATS)/EMPYEMA (Left) Subjective:  No complaints. Wants foley out.  Objective: Vital signs in last 24 hours: Temp:  [97.8 F (36.6 C)-98.9 F (37.2 C)] 98.1 F (36.7 C) (08/04 0804) Pulse Rate:  [68-105] 74 (08/04 1100) Cardiac Rhythm: Normal sinus rhythm (08/04 0800) Resp:  [10-38] 14 (08/04 1100) BP: (118-156)/(73-104) 128/86 (08/04 0900) SpO2:  [90 %-98 %] 94 % (08/04 1100) Weight:  [81.2 kg (179 lb 0.2 oz)] 81.2 kg (179 lb 0.2 oz) (08/04 0600)  Hemodynamic parameters for last 24 hours:    Intake/Output from previous day: 08/03 0701 - 08/04 0700 In: 2012.2 [P.O.:960; I.V.:552.1; IV Piggyback:500.1] Out: 2435 [Urine:2345; Chest Tube:90] Intake/Output this shift: Total I/O In: 24.7 [I.V.:24.7] Out: 325 [Urine:325]  General appearance: alert and cooperative Neurologic: intact Heart: regular rate and rhythm, S1, S2 normal, no murmur, click, rub or gallop Lungs: diminished breath sounds LLL Extremities: extremities normal, atraumatic, no cyanosis or edema Wound: dressing dry  Lab Results: Recent Labs    05/28/18 0505 05/29/18 0216  WBC 14.3* 10.0  HGB 14.0 13.2  HCT 43.0 41.1  PLT 387 332   BMET:  Recent Labs    05/28/18 0505 05/29/18 0216  NA 137 141  K 4.4 4.3  CL 99 98  CO2 27 31  GLUCOSE 122* 160*  BUN 11 12  CREATININE 0.66 0.67  CALCIUM 8.6* 9.0    PT/INR: No results for input(s): LABPROT, INR in the last 72 hours. ABG    Component Value Date/Time   PHART 7.407 05/28/2018 0553   HCO3 32.0 (H) 05/28/2018 0553   TCO2 33 (H) 05/28/2018 0553   O2SAT 98.0 05/28/2018 0553   CBG (last 3)  Recent Labs    05/27/18 2359 05/28/18 0402 05/28/18 0747  GLUCAP 129* 130* 118*   CLINICAL DATA:  Empyema in left pleural space. Status post thoracotomy.  EXAM: PORTABLE CHEST 1 VIEW  COMPARISON:  05/28/2018  FINDINGS: Right jugular central venous catheter remains in  appropriate position. Two left-sided chest tubes remain in place. Small left pleural effusion or thickening persists, with mild atelectasis in the left lung base. Minimal subsegmental atelectasis in the right lung base is also unchanged. Heart size is within normal limits.  IMPRESSION: Stable small left pleural effusion versus thickening and left basilar atelectasis. No pneumothorax visualized.   Electronically Signed   By: Myles RosenthalJohn  Stahl M.D.   On: 05/29/2018 07:22  Assessment/Plan: S/P Procedure(s) (LRB): VIDEO ASSISTED THORACOSCOPY (VATS)/EMPYEMA (Left)  Hemodynamically stable in sinus rhythm  CXR stable with minimal chest tube output. Leukocytosis resolved and afebrile. Will remove anterior chest tube today.  Operative cultures negative so far. Continue antibiotic.  IS, ambulation   LOS: 6 days    Alleen BorneBryan K Aariel Ems 05/29/2018

## 2018-05-29 NOTE — Progress Notes (Signed)
PROGRESS NOTE    John Rodriguez  ZOX:[id number] DOB: 1966/01/06 DOA: 05/21/2018 PCP: Lavinia Sharps, NP    Brief Narrative:  52 year old male who presented with chest pain. He does have significant past medical history of tobacco abuse. Reported 1 week history of chest pain at the left side nonexertional related, no improving or worsening factors. No cough or fever.His initial physical examination blood pressure 158/94, heart rate 91, respiratory 21, oxygen saturation 95%. His lungs had decreased breath sounds at the left base, no wheezing, rales or rhonchi, heart S1-S2 present rhythmic, abdomen soft nontender, no lower extremity edema.His chest x-ray showed left pleural effusion.  Patient was admitted to the hospital working diagnosis of left pleural effusion.   Assessment & Plan:   Principal Problem:   Pleural effusion on left Active Problems:   Tobacco abuse   S/P thoracentesis   Empyema of pleural space (HCC)   1. Left exudate pleural effusion/ possible parapneumonic effusion, present on admission. sp VATS large amounts of fibrinous exudate and glue like material in the pleural space, multiple pockets of fluid were opened and drained, about one liter obtained. Continue pain control with (fentanyl PCA), oxycodone and tramadol. Antibiotic therapy with Vancomycin and IV ceftriaxone. Chest tube drain over last 24 hours 90 ml. Chest film personally reviewed, noted no pneumothorax, chest tubes in place, small opacity at the left base.   2. Thyroid nodule/ benign follicular nodule.   3. HTN. Blood pressure 130 systolic. Patient started on metoprolol 25 mg po bid.    DVT prophylaxis:enoxaparin Code Status:full Family Communication:no family at the bedside Disposition Plan/ discharge barriers:Pending removal of chest tube.   Consultants:  Pulmonary  Procedures:    Antimicrobials    Subjective: Patient is feeling well, pain is controlled  with analgesics, out of bed to chair, no nausea or vomiting, no dyspnea.   Objective: Vitals:   05/29/18 0700 05/29/18 0748 05/29/18 0800 05/29/18 0804  BP: 118/79  126/87   Pulse: 84  87   Resp: 12 12 10    Temp:    98.1 F (36.7 C)  TempSrc:    Oral  SpO2: 90% 92% 92%   Weight:      Height:        Intake/Output Summary (Last 24 hours) at 05/29/2018 0934 Last data filed at 05/29/2018 0800 Gross per 24 hour  Intake 1611.36 ml  Output 2435 ml  Net -823.64 ml   Filed Weights   05/27/18 1417 05/28/18 0600 05/29/18 0600  Weight: 83.9 kg (185 lb) 79.3 kg (174 lb 12.8 oz) 81.2 kg (179 lb 0.2 oz)    Examination:   General: Not in pain or dyspnea. Deconditioned  Neurology: Awake and alert, non focal  E ENT: no pallor, no icterus, oral mucosa moist Cardiovascular: No JVD. S1-S2 present, rhythmic, no gallops, rubs, or murmurs. No lower extremity edema. Pulmonary: vesicular breath sounds bilaterally, adequate air movement, no wheezing, rhonchi or rales. Gastrointestinal. Abdomen with no organomegaly, non tender, no rebound or guarding Skin. No rashes Musculoskeletal: no joint deformities  Chest tubes in place on the left, no air leak noted.    Data Reviewed: I have personally reviewed following labs and imaging studies  CBC: Recent Labs  Lab 05/24/18 0231 05/26/18 0822 05/28/18 0505 05/29/18 0216  WBC 8.4 8.2 14.3* 10.0  HGB 14.8 14.5 14.0 13.2  HCT 46.3 45.4 43.0 41.1  MCV 96.5 95.2 95.8 96.7  PLT 291 308 387 332   Basic Metabolic Panel: Recent Labs  Lab 05/24/18 0231 05/26/18 0230 05/26/18 0822 05/28/18 0505 05/29/18 0216  NA 139 139 140 137 141  K 4.3 4.1 4.2 4.4 4.3  CL 100 102 101 99 98  CO2 29 27 28 27 31   GLUCOSE 99 108* 137* 122* 160*  BUN 8 11 12 11 12   CREATININE 0.80 0.73 0.84 0.66 0.67  CALCIUM 9.1 8.9 9.4 8.6* 9.0   GFR: Estimated Creatinine Clearance: 110.2 mL/min (by C-G formula based on SCr of 0.67 mg/dL). Liver Function Tests: Recent Labs    Lab 05/26/18 0230 05/26/18 0822 05/29/18 0216  AST 38 35 38  ALT 45* 46* 40  ALKPHOS 67 67 78  BILITOT 0.7 0.7 0.5  PROT 6.5 6.9 6.1*  ALBUMIN 2.7* 3.0* 2.5*   No results for input(s): LIPASE, AMYLASE in the last 168 hours. No results for input(s): AMMONIA in the last 168 hours. Coagulation Profile: Recent Labs  Lab 05/26/18 0230 05/26/18 0822  INR 1.08 1.11   Cardiac Enzymes: Recent Labs  Lab 05/22/18 1449  TROPONINI <0.03   BNP (last 3 results) No results for input(s): PROBNP in the last 8760 hours. HbA1C: No results for input(s): HGBA1C in the last 72 hours. CBG: Recent Labs  Lab 05/27/18 1832 05/27/18 2052 05/27/18 2359 05/28/18 0402 05/28/18 0747  GLUCAP 129* 133* 129* 130* 118*   Lipid Profile: No results for input(s): CHOL, HDL, LDLCALC, TRIG, CHOLHDL, LDLDIRECT in the last 72 hours. Thyroid Function Tests: No results for input(s): TSH, T4TOTAL, FREET4, T3FREE, THYROIDAB in the last 72 hours. Anemia Panel: No results for input(s): VITAMINB12, FOLATE, FERRITIN, TIBC, IRON, RETICCTPCT in the last 72 hours.    Radiology Studies: I have reviewed all of the imaging during this hospital visit personally     Scheduled Meds: . acetaminophen  1,000 mg Oral Q6H   Or  . acetaminophen (TYLENOL) oral liquid 160 mg/5 mL  1,000 mg Oral Q6H  . bisacodyl  10 mg Oral Daily  . fentaNYL   Intravenous Q4H  . mouth rinse  15 mL Mouth Rinse BID  . metoprolol tartrate  25 mg Oral BID  . senna-docusate  1 tablet Oral QHS   Continuous Infusions: . cefTRIAXone (ROCEPHIN)  IV 2 g (05/29/18 0629)  . dextrose 5 % and 0.45% NaCl 10 mL/hr at 05/29/18 0800  . potassium chloride    . vancomycin Stopped (05/28/18 2230)     LOS: 6 days        John Rodriguez Annett John Yumi Insalaco, MD [facility] Pager [phone]

## 2018-05-29 NOTE — Plan of Care (Signed)

## 2018-05-30 ENCOUNTER — Inpatient Hospital Stay (HOSPITAL_COMMUNITY): Payer: Self-pay

## 2018-05-30 LAB — CBC WITH DIFFERENTIAL/PLATELET
ABS IMMATURE GRANULOCYTES: 0.2 10*3/uL — AB (ref 0.0–0.1)
BASOS ABS: 0.1 10*3/uL (ref 0.0–0.1)
BASOS PCT: 1 %
EOS ABS: 0.4 10*3/uL (ref 0.0–0.7)
Eosinophils Relative: 4 %
HCT: 39.4 % (ref 39.0–52.0)
Hemoglobin: 12.3 g/dL — ABNORMAL LOW (ref 13.0–17.0)
Immature Granulocytes: 2 %
Lymphocytes Relative: 24 %
Lymphs Abs: 2.1 10*3/uL (ref 0.7–4.0)
MCH: 30.5 pg (ref 26.0–34.0)
MCHC: 31.2 g/dL (ref 30.0–36.0)
MCV: 97.8 fL (ref 78.0–100.0)
Monocytes Absolute: 1.2 10*3/uL — ABNORMAL HIGH (ref 0.1–1.0)
Monocytes Relative: 13 %
NEUTROS ABS: 5 10*3/uL (ref 1.7–7.7)
Neutrophils Relative %: 56 %
PLATELETS: 328 10*3/uL (ref 150–400)
RBC: 4.03 MIL/uL — ABNORMAL LOW (ref 4.22–5.81)
RDW: 13.2 % (ref 11.5–15.5)
WBC: 8.9 10*3/uL (ref 4.0–10.5)

## 2018-05-30 LAB — BASIC METABOLIC PANEL
Anion gap: 9 (ref 5–15)
BUN: 14 mg/dL (ref 6–20)
CALCIUM: 8.8 mg/dL — AB (ref 8.9–10.3)
CO2: 29 mmol/L (ref 22–32)
Chloride: 103 mmol/L (ref 98–111)
Creatinine, Ser: 0.64 mg/dL (ref 0.61–1.24)
GFR calc Af Amer: >60 mL/min (ref >60)
Glucose, Bld: 91 mg/dL (ref 70–99)
POTASSIUM: 4.1 mmol/L (ref 3.5–5.1)
SODIUM: 141 mmol/L (ref 135–145)

## 2018-05-30 LAB — VANCOMYCIN, TROUGH: Vancomycin Tr: 8 ug/mL — ABNORMAL LOW (ref 15–20)

## 2018-05-30 MED ORDER — FENTANYL CITRATE (PF) 100 MCG/2ML IJ SOLN
50.0000 ug | INTRAMUSCULAR | Status: DC | PRN
  Administered 2018-05-30: 50 ug via INTRAVENOUS
  Filled 2018-05-30: qty 2

## 2018-05-30 MED ORDER — VANCOMYCIN HCL 10 G IV SOLR
1250.0000 mg | Freq: Three times a day (TID) | INTRAVENOUS | Status: DC
  Administered 2018-05-30 – 2018-05-31 (×3): 1250 mg via INTRAVENOUS
  Filled 2018-05-30 (×4): qty 1250

## 2018-05-30 MED ORDER — LISINOPRIL 5 MG PO TABS
5.0000 mg | ORAL_TABLET | Freq: Every day | ORAL | Status: DC
  Administered 2018-05-30 – 2018-05-31 (×2): 5 mg via ORAL
  Filled 2018-05-30 (×2): qty 1

## 2018-05-30 MED ORDER — ENOXAPARIN SODIUM 40 MG/0.4ML ~~LOC~~ SOLN
40.0000 mg | Freq: Every day | SUBCUTANEOUS | Status: DC
  Administered 2018-05-30 – 2018-05-31 (×2): 40 mg via SUBCUTANEOUS
  Filled 2018-05-30 (×2): qty 0.4

## 2018-05-30 NOTE — Progress Notes (Signed)
Pharmacy Antibiotic Note  John Rodriguez is a 52 y.o. male admitted on 05/21/2018 with empyema.  Pharmacy has been consulted for vancomycin dosing. Pt s/p VATS on 8/2, SCr <1 mg/dl.  Height: 5\' 7"  (170.2 cm) Weight: 179 lb 0.2 oz (81.2 kg) IBW/kg (Calculated) : 66.1  Temp (24hrs), Avg:98.4 F (36.9 C), Min:97.3 F (36.3 C), Max:99.4 F (37.4 C)  Recent Labs  Lab 05/24/18 0231 05/26/18 0230 05/26/18 0822 05/28/18 0505 05/29/18 0216 05/30/18 0323 05/30/18 0941  WBC 8.4  --  8.2 14.3* 10.0 8.9  --   CREATININE 0.80 0.73 0.84 0.66 0.67 0.64  --   VANCOTROUGH  --   --   --   --   --   --  8*    Estimated Creatinine Clearance: 110.2 mL/min (by C-G formula based on SCr of 0.64 mg/dL).    No Known Allergies  Antimicrobials this admission: Levofloxacin 7/28 >> 7/31 Ceftriaxone 8/1 >> Cefazolin x1 8/2 Vancomycin 8/2 >>  Dose adjustments this admission: none  Microbiology results: 7/28 Pleural Cx: NGTD 8/2 AFB: sent 8/2 Pleural Cx: NGTD  Assessment: Vancomycin trough subtherapeutic at 8 on vancomycin 1250 mg IV q12h. Renal function stable. Goal trough 15-20 for empyema.   Plan: - Increase vancomycin 1250mg  IV q8h - Monitor renal function, cultures, LOT - Check vancomycin trough at Css  Thank you for allowing pharmacy to be a part of this patient's care.  Marcelino FreestoneEmily McElhaney, PharmD PGY2 Cardiology Pharmacy Resident Phone [phone] 05/30/2018 12:25 PM

## 2018-05-30 NOTE — Progress Notes (Signed)
Fentanyl PCA D/C'd, 150mL wasted and witnessed with Merry LoftyBecky Rock, RN

## 2018-05-30 NOTE — Discharge Summary (Signed)
Physician Discharge Summary  Patient ID: John Rodriguez MRN: [id number] DOB/AGE: 12-23-1965 52 y.o.  Admit date: 05/21/2018 Discharge date: 05/31/2018  Admission Diagnoses:  Patient Active Problem List   Diagnosis Date Noted  . Empyema of pleural space (HCC) 05/27/2018  . S/P thoracentesis   . Tobacco abuse 05/22/2018  . Pleural effusion on left 05/21/2018   Discharge Diagnoses:   Patient Active Problem List   Diagnosis Date Noted  . Empyema of pleural space (HCC) 05/27/2018  . S/P thoracentesis   . Tobacco abuse 05/22/2018  . Pleural effusion on left 05/21/2018   Discharged Condition: good  History of Present Illness:  John Rodriguez is a 52 yo AA male with known history of nicotine abuse. He presented with complaints of left sided chest pain and shortness of breath.  He was found to have a left lower lobe pneumonia and a loculated pleural effusion.  He underwent Thoracentesis which showed white cells and protein but was negative for malignant cells.  The effusion re-accumulated immediately and the patient did not feel better overall.  It was felt VATS procedure would be indicated and TCTS consult was obtained.  He was evaluated by Dr. Donata Clay who recommended VATS procedure with drainage of empyema.  The risks and benefits of the procedure were explained to the patient and he was agreeable to proceed.    Hospital Course:   He was treated with broad spectrum ABX during his hospital stay.  He was taken to the operating room on 05/27/2018.  He underwent Left VATS with drainage of empyema.  Cultures were obtained.  He tolerated the procedure without difficulty, was extubated and taken to the SICU in stable condition.  He has done well post operatively.  He has had marginal chest tube output since his procedure.  His chest tubes were free of air leak and were placed on water seal on POD #1.  His CXR has remained clinically stable with improvement of left sided pleural effusion.  His anterior  chest tube as removed on 05/29/2018.  His posterior chest tube was removed on 05/30/2018.  Follow up CXR showed no evidence of pneumothorax.  There was residual scarring, effusion, and infiltrate on the left.  His OR cultures have remained negative to date.  He will be transitioned to an oral regimen of Augmentin for discharge.  He was hypertensive and started on low dose Lisinopril for treatment of this.  He is ambulating independently.  His pain is well controlled.  He is medically stable for discharge home today.  Significant Diagnostic Studies: radiology:   CT scan: Moderate to large left pleural effusion with left lower lobe and lingular atelectasis.  3.2 cm left thyroid nodule. This could be further characterized with elective thyroid ultrasound.  Treatments: surgery:   Left VATS (video-assisted thoracoscopic surgery) with drainage of left eye empyema and decortication of left lung.  Discharge Exam: Blood pressure (!) 137/92, pulse (!) 54, temperature 98.7 F (37.1 C), temperature source Oral, resp. rate (!) 30, height 5\' 7"  (1.702 m), weight 179 lb 0.2 oz (81.2 kg), SpO2 93 %.    General appearance: alert, cooperative and no distress Heart: regular rate and rhythm Lungs: clear to auscultation bilaterally Abdomen: soft, non-tender; bowel sounds normal; no masses,  no organomegaly Extremities: extremities normal, atraumatic, no cyanosis or edema Wound: clean and dry  Disposition: Home  Discharge Medications:   Allergies as of 05/31/2018   No Known Allergies     Medication List    TAKE  these medications   acetaminophen 500 MG tablet Commonly known as:  TYLENOL Take 2 tablets (1,000 mg total) by mouth every 6 (six) hours as needed for mild pain or fever.   amoxicillin-clavulanate 875-125 MG tablet Commonly known as:  AUGMENTIN Take 1 tablet by mouth 2 (two) times daily.   lisinopril 5 MG tablet Commonly known as:  PRINIVIL,ZESTRIL Take 1 tablet (5 mg total) by mouth  daily.   naproxen sodium 220 MG tablet Commonly known as:  ALEVE Take 220 mg by mouth 2 (two) times daily as needed (for pain).   oxyCODONE 5 MG immediate release tablet Commonly known as:  Oxy IR/ROXICODONE Take 1 tablet (5 mg total) by mouth every 4 (four) hours as needed for severe pain.      Follow-up Information    Kerin PernaVan Trigt, Peter, MD Follow up on 06/29/2018.   Specialty:  Cardiothoracic Surgery Why:  Appointment is at 3:00 Contact information: [address] [place] [address] [phone]        [facility] Follow up on 06/14/2018.   Specialty:  Cardiothoracic Surgery Why:  Appointment is at 10:00 for suture removal Contact information: [address] [place] [id number] [phone]          Signed: Lowella Dandyrin Lexani Corona 05/31/2018, 7:55 AM

## 2018-05-30 NOTE — Progress Notes (Signed)
PROGRESS NOTE    John Rodriguez  ZOX:[id number] DOB: 03-07-1966 DOA: 05/21/2018 PCP: Lavinia Sharps, NP    Brief Narrative:  52 year old male who presented with chest pain. He does have significant past medical history of tobacco abuse. Reported 1 week history of chest pain at the left side nonexertional related, no improving or worsening factors. No cough or fever.His initial physical examination blood pressure 158/94, heart rate 91, respiratory 21, oxygen saturation 95%. His lungs had decreased breath sounds at the left base, no wheezing, rales or rhonchi, heart S1-S2 present rhythmic, abdomen soft nontender, no lower extremity edema.His chest x-ray showed left pleural effusion.  Patient was admitted to the hospital working diagnosis of left pleural effusion.   Assessment & Plan:   Principal Problem:   Pleural effusion on left Active Problems:   Tobacco abuse   S/P thoracentesis   Empyema of pleural space (HCC)  1. Left exudate pleural effusion/ possible parapneumonic effusion, present on admission. sp VATS large amounts of fibrinous exudate and glue like material in the pleural space, multiple pockets of fluid were opened and drained, about one liter obtained. Pain control with oxycodone and tramadol. On Vancomycin and IV ceftriaxone for antibiotic therapy. Further antibiotic therapy per primary/  2. Thyroid nodule/benign follicular nodule.  3. HTN.Continue metoprolol 25 mg po bid.   DVT prophylaxis:enoxaparin Code Status:full Family Communication:no family at the bedside Disposition Plan/ discharge barriers:Per primary  Consultants:  Pulmonary  Procedures:    Antimicrobials   Subjective: Patient is feeling better, pain is controlled, one out of two chest tubes have been removed, no dyspnea, no nausea or vomiting.   Objective: Vitals:   05/30/18 0500 05/30/18 0613 05/30/18 0700 05/30/18 0727  BP: (!) 132/93 133/86 (!) 139/93    Pulse: (!) 59 74 67   Resp: 11 15 17    Temp:    98.1 F (36.7 C)  TempSrc:    Oral  SpO2: (!) 89% 92% 90%   Weight:      Height:        Intake/Output Summary (Last 24 hours) at 05/30/2018 0915 Last data filed at 05/30/2018 0700 Gross per 24 hour  Intake 680.76 ml  Output 1590 ml  Net -909.24 ml   Filed Weights   05/27/18 1417 05/28/18 0600 05/29/18 0600  Weight: 83.9 kg (185 lb) 79.3 kg (174 lb 12.8 oz) 81.2 kg (179 lb 0.2 oz)    Examination:   General: Not in pain or dyspnea, deconditioned  Neurology: Awake and alert, non focal  E ENT: mild pallor, no icterus, oral mucosa moist Cardiovascular: No JVD. S1-S2 present, rhythmic, no gallops, rubs, or murmurs. No lower extremity edema. Pulmonary: positive breath sounds bilaterally, adequate air movement, no wheezing, rhonchi or rales. Gastrointestinal. Abdomen with no organomegaly, non tender, no rebound or guarding Skin. No rashes Musculoskeletal: no joint deformities     Data Reviewed: I have personally reviewed following labs and imaging studies  CBC: Recent Labs  Lab 05/24/18 0231 05/26/18 0822 05/28/18 0505 05/29/18 0216 05/30/18 0323  WBC 8.4 8.2 14.3* 10.0 8.9  NEUTROABS  --   --   --   --  5.0  HGB 14.8 14.5 14.0 13.2 12.3*  HCT 46.3 45.4 43.0 41.1 39.4  MCV 96.5 95.2 95.8 96.7 97.8  PLT 291 308 387 332 328   Basic Metabolic Panel: Recent Labs  Lab 05/26/18 0230 05/26/18 0822 05/28/18 0505 05/29/18 0216 05/30/18 0323  NA 139 140 137 141 141  K 4.1 4.2 4.4  4.3 4.1  CL 102 101 99 98 103  CO2 27 28 27 31 29   GLUCOSE 108* 137* 122* 160* 91  BUN 11 12 11 12 14   CREATININE 0.73 0.84 0.66 0.67 0.64  CALCIUM 8.9 9.4 8.6* 9.0 8.8*   GFR: Estimated Creatinine Clearance: 110.2 mL/min (by C-G formula based on SCr of 0.64 mg/dL). Liver Function Tests: Recent Labs  Lab 05/26/18 0230 05/26/18 0822 05/29/18 0216  AST 38 35 38  ALT 45* 46* 40  ALKPHOS 67 67 78  BILITOT 0.7 0.7 0.5  PROT 6.5 6.9 6.1*   ALBUMIN 2.7* 3.0* 2.5*   No results for input(s): LIPASE, AMYLASE in the last 168 hours. No results for input(s): AMMONIA in the last 168 hours. Coagulation Profile: Recent Labs  Lab 05/26/18 0230 05/26/18 0822  INR 1.08 1.11   Cardiac Enzymes: No results for input(s): CKTOTAL, CKMB, CKMBINDEX, TROPONINI in the last 168 hours. BNP (last 3 results) No results for input(s): PROBNP in the last 8760 hours. HbA1C: No results for input(s): HGBA1C in the last 72 hours. CBG: Recent Labs  Lab 05/27/18 1832 05/27/18 2052 05/27/18 2359 05/28/18 0402 05/28/18 0747  GLUCAP 129* 133* 129* 130* 118*   Lipid Profile: No results for input(s): CHOL, HDL, LDLCALC, TRIG, CHOLHDL, LDLDIRECT in the last 72 hours. Thyroid Function Tests: No results for input(s): TSH, T4TOTAL, FREET4, T3FREE, THYROIDAB in the last 72 hours. Anemia Panel: No results for input(s): VITAMINB12, FOLATE, FERRITIN, TIBC, IRON, RETICCTPCT in the last 72 hours.    Radiology Studies: I have reviewed all of the imaging during this hospital visit personally     Scheduled Meds: . acetaminophen  1,000 mg Oral Q6H   Or  . acetaminophen (TYLENOL) oral liquid 160 mg/5 mL  1,000 mg Oral Q6H  . bisacodyl  10 mg Oral Daily  . enoxaparin (LOVENOX) injection  40 mg Subcutaneous Daily  . lisinopril  5 mg Oral Daily  . mouth rinse  15 mL Mouth Rinse BID  . senna-docusate  1 tablet Oral QHS   Continuous Infusions: . cefTRIAXone (ROCEPHIN)  IV 2 g (05/30/18 0615)  . dextrose 5 % and 0.45% NaCl 10 mL/hr at 05/30/18 0700  . potassium chloride    . vancomycin Stopped (05/29/18 2235)     LOS: 7 days        Mauricio Annett Gulaaniel Arrien, MD [facility] Pager [phone]

## 2018-05-30 NOTE — Progress Notes (Addendum)
TCTS DAILY ICU PROGRESS NOTE                   [address]            [place] [address]          [phone]   3 Days Post-Op Procedure(s) (LRB): VIDEO ASSISTED THORACOSCOPY (VATS)/EMPYEMA (Left)  Total Length of Stay:  LOS: 7 days   Subjective:  Patient has pain with deep inspiration.  Otherwise is doing okay.  He does get relief with pain medication.  + ambulation + BM  Objective: Vital signs in last 24 hours: Temp:  [97.8 F (36.6 C)-99.4 F (37.4 C)] 98.1 F (36.7 C) (08/05 0727) Pulse Rate:  [59-93] 67 (08/05 0700) Cardiac Rhythm: Normal sinus rhythm (08/05 0740) Resp:  [10-24] 17 (08/05 0700) BP: (123-149)/(80-96) 139/93 (08/05 0700) SpO2:  [89 %-100 %] 90 % (08/05 0700)  Filed Weights   05/27/18 1417 05/28/18 0600 05/29/18 0600  Weight: 185 lb (83.9 kg) 174 lb 12.8 oz (79.3 kg) 179 lb 0.2 oz (81.2 kg)    Weight change:    Intake/Output from previous day: 08/04 0701 - 08/05 0700 In: 705.5 [I.V.:205.6; IV Piggyback:499.9] Out: 1740 [Urine:1700; Chest Tube:40]  Current Meds: Scheduled Meds: . acetaminophen  1,000 mg Oral Q6H   Or  . acetaminophen (TYLENOL) oral liquid 160 mg/5 mL  1,000 mg Oral Q6H  . bisacodyl  10 mg Oral Daily  . enoxaparin (LOVENOX) injection  40 mg Subcutaneous Daily  . lisinopril  5 mg Oral Daily  . mouth rinse  15 mL Mouth Rinse BID  . senna-docusate  1 tablet Oral QHS   Continuous Infusions: . cefTRIAXone (ROCEPHIN)  IV 2 g (05/30/18 0615)  . dextrose 5 % and 0.45% NaCl 10 mL/hr at 05/30/18 0700  . potassium chloride    . vancomycin Stopped (05/29/18 2235)   PRN Meds:.fentaNYL (SUBLIMAZE) injection, oxyCODONE, potassium chloride, traMADol  General appearance: alert, cooperative and no distress Heart: regular rate and rhythm Lungs: clear to auscultation bilaterally Abdomen: soft, non-tender; bowel sounds normal; no masses,  no organomegaly Extremities: extremities normal, atraumatic, no cyanosis or  edema Wound: clean and dry  Lab Results: CBC: Recent Labs    05/29/18 0216 05/30/18 0323  WBC 10.0 8.9  HGB 13.2 12.3*  HCT 41.1 39.4  PLT 332 328   BMET:  Recent Labs    05/29/18 0216 05/30/18 0323  NA 141 141  K 4.3 4.1  CL 98 103  CO2 31 29  GLUCOSE 160* 91  BUN 12 14  CREATININE 0.67 0.64  CALCIUM 9.0 8.8*    CMET: Lab Results  Component Value Date   WBC 8.9 05/30/2018   HGB 12.3 (L) 05/30/2018   HCT 39.4 05/30/2018   PLT 328 05/30/2018   GLUCOSE 91 05/30/2018   ALT 40 05/29/2018   AST 38 05/29/2018   NA 141 05/30/2018   K 4.1 05/30/2018   CL 103 05/30/2018   CREATININE 0.64 05/30/2018   BUN 14 05/30/2018   CO2 29 05/30/2018   TSH 0.946 05/22/2018   INR 1.11 05/26/2018   HGBA1C 5.6 05/26/2018      PT/INR: No results for input(s): LABPROT, INR in the last 72 hours. Radiology: Dg Chest Port 1 View  Result Date: 05/30/2018 CLINICAL DATA:  Chest tube present. EXAM: PORTABLE CHEST 1 VIEW COMPARISON:  05/29/2018 FINDINGS: A right jugular catheter terminates over the lower SVC. One left-sided chest tube has been removed while  the other remains. The cardiomediastinal silhouette is unchanged. A small left pleural effusion and/or pleural thickening is unchanged. Left basilar parenchymal lung opacity does not appear significantly changed and likely represents atelectasis. There is persistent minimal right basilar atelectasis as well. No pneumothorax is identified. IMPRESSION: 1. Interval removal of 1 of the 2 left-sided chest tubes. No pneumothorax. 2. Unchanged left basilar atelectasis and small left pleural effusion and/or pleural thickening. Electronically Signed   By: Sebastian AcheAllen  Grady M.D.   On: 05/30/2018 07:46     Assessment/Plan: S/P Procedure(s) (LRB): VIDEO ASSISTED THORACOSCOPY (VATS)/EMPYEMA (Left)  1. CV- NSR, + HTN- started on lopressor over the weekend, however he develops bradycardia with rate in the 40s in the evening- will stop lopressor and start  Lisinopril 2. Pulm- chest tube with minimal output, no air leak present- will d/c chest tube today 3. Renal- creatinine WNL 4. ID- remains afebrile, leukocytosis resolved, OR cultures remain negative to date, continue broad spectrum ABX coverage 5. Dispo- patient stable, d/c chest tube, continue ABX for now, OR cultures remain negative, transfer to telemetry unit  Erin Barrett 05/30/2018 7:52 AM   No growth of pleural material Path shows inflammation- no malignancy Cont iv antibiotics in hospital and transition to po augmentin at DC  patient examined and medical record reviewed,agree with above note. Kathlee Nationseter Van Trigt III 05/30/2018

## 2018-05-30 NOTE — Discharge Instructions (Signed)
Discharge Instructions: ° °1. You may shower, please wash incisions daily with soap and water and keep dry.  If you wish to cover wounds with dressing you may do so but please keep clean and change daily.  No tub baths or swimming until incisions have completely healed.  If your incisions become red or develop any drainage please call our office at [phone] ° °2. No Driving until cleared by Dr. Van Trigt's  office and you are no longer using narcotic pain medications ° °3. Fever of 101.5 for at least 24 hours with no source, please contact our office at [phone] ° °4. Activity- up as tolerated, please walk at least 3 times per day.  Avoid strenuous activity, no lifting, pushing, or pulling with your arms over 8-10 lbs for a minimum of 6 weeks ° °5. If any questions or concerns arise, please do not hesitate to contact our office at [phone] ° °

## 2018-05-30 NOTE — Progress Notes (Signed)
Patient ambulated 740 ft in hallway using avasys walker.

## 2018-05-30 NOTE — Progress Notes (Signed)
CT surgery p.m. Rounds  Remaining chest tube out Pain improved Cultures from operative specimens continue no growth Waiting for bed on telemetry floor

## 2018-05-31 ENCOUNTER — Inpatient Hospital Stay (HOSPITAL_COMMUNITY): Payer: Self-pay

## 2018-05-31 MED ORDER — OXYCODONE HCL 5 MG PO TABS: 5.0000 mg | ORAL_TABLET | ORAL | 0 refills | Status: DC | PRN

## 2018-05-31 MED ORDER — AMOXICILLIN-POT CLAVULANATE 875-125 MG PO TABS: 1.0000 | ORAL_TABLET | Freq: Two times a day (BID) | ORAL | 0 refills | Status: DC

## 2018-05-31 MED ORDER — LISINOPRIL 5 MG PO TABS: 5.0000 mg | ORAL_TABLET | Freq: Every day | ORAL | 3 refills | Status: AC

## 2018-05-31 MED ORDER — ACETAMINOPHEN 500 MG PO TABS: 1000.0000 mg | ORAL_TABLET | Freq: Four times a day (QID) | ORAL | 0 refills | Status: DC | PRN

## 2018-05-31 NOTE — Progress Notes (Signed)
Spoke w Lavinia SharpsMary Ann [name] NP of [facility]. Office will call patient to schedule follow up appointment. Reviewed DC meds w Chales AbrahamsMary Ann, and patient will be [facility].

## 2018-05-31 NOTE — Progress Notes (Addendum)
TCTS DAILY ICU PROGRESS NOTE                   [address]            [place] [id number]          [phone]   4 Days Post-Op Procedure(s) (LRB): VIDEO ASSISTED THORACOSCOPY (VATS)/EMPYEMA (Left)  Total Length of Stay:  LOS: 8 days   Subjective:  No new complaints.  States he is feeling pretty good.  Denies chest pain and shortness of breath.  Ready to go home.  + ambulation   + BM  Objective: Vital signs in last 24 hours: Temp:  [97.3 F (36.3 C)-98.7 F (37.1 C)] 98.7 F (37.1 C) (08/06 0357) Pulse Rate:  [54-92] 54 (08/06 0700) Cardiac Rhythm: Normal sinus rhythm (08/06 0400) Resp:  [12-53] 30 (08/06 0700) BP: (116-162)/(68-119) 137/92 (08/06 0700) SpO2:  [92 %-95 %] 93 % (08/05 1700)  Filed Weights   05/27/18 1417 05/28/18 0600 05/29/18 0600  Weight: 185 lb (83.9 kg) 174 lb 12.8 oz (79.3 kg) 179 lb 0.2 oz (81.2 kg)    Weight change:    Intake/Output from previous day: 08/05 0701 - 08/06 0700 In: 1376.6 [P.O.:600; I.V.:72.3; IV Piggyback:704.3] Out: 1370 [Urine:1370]  Current Meds: Scheduled Meds: . acetaminophen  1,000 mg Oral Q6H   Or  . acetaminophen (TYLENOL) oral liquid 160 mg/5 mL  1,000 mg Oral Q6H  . bisacodyl  10 mg Oral Daily  . enoxaparin (LOVENOX) injection  40 mg Subcutaneous Daily  . lisinopril  5 mg Oral Daily  . mouth rinse  15 mL Mouth Rinse BID  . senna-docusate  1 tablet Oral QHS   Continuous Infusions: . cefTRIAXone (ROCEPHIN)  IV 2 g (05/31/18 [id number])  . dextrose 5 % and 0.45% NaCl Stopped (05/30/18 2015)  . potassium chloride    . vancomycin Stopped (05/31/18 0329)   PRN Meds:.fentaNYL (SUBLIMAZE) injection, oxyCODONE, potassium chloride, traMADol  General appearance: alert, cooperative and no distress Heart: regular rate and rhythm Lungs: clear to auscultation bilaterally Abdomen: soft, non-tender; bowel sounds normal; no masses,  no organomegaly Extremities: extremities normal, atraumatic, no cyanosis or  edema Wound: clean and dry  Lab Results: CBC: Recent Labs    05/29/18 0216 05/30/18 0323  WBC 10.0 8.9  HGB 13.2 12.3*  HCT 41.1 39.4  PLT 332 328   BMET:  Recent Labs    05/29/18 0216 05/30/18 0323  NA 141 141  K 4.3 4.1  CL 98 103  CO2 31 29  GLUCOSE 160* 91  BUN 12 14  CREATININE 0.67 0.64  CALCIUM 9.0 8.8*    CMET: Lab Results  Component Value Date   WBC 8.9 05/30/2018   HGB 12.3 (L) 05/30/2018   HCT 39.4 05/30/2018   PLT 328 05/30/2018   GLUCOSE 91 05/30/2018   ALT 40 05/29/2018   AST 38 05/29/2018   NA 141 05/30/2018   K 4.1 05/30/2018   CL 103 05/30/2018   CREATININE 0.64 05/30/2018   BUN 14 05/30/2018   CO2 29 05/30/2018   TSH 0.946 05/22/2018   INR 1.11 05/26/2018   HGBA1C 5.6 05/26/2018      PT/INR: No results for input(s): LABPROT, INR in the last 72 hours. Radiology: Dg Chest 2 View  Result Date: 05/31/2018 CLINICAL DATA:  Chest tube placement. EXAM: CHEST - 2 VIEW COMPARISON:  05/30/2018.  05/29/2018.  05/28/2018. FINDINGS: Mediastinum and hilar structures normal. Left lower lobe infiltrate with small  left pleural effusion again noted, similar findings noted on prior exam. Mild right base subsegmental atelectasis noted. No pneumothorax. Heart size stable. Degenerative changes thoracic spine. IMPRESSION: Left lower lobe atelectasis/infiltrate and small left pleural effusion again noted. Similar findings noted on prior exam. Mild right base subsegmental atelectasis. Electronically Signed   By: Maisie Fus  Register   On: 05/31/2018 07:17     Assessment/Plan: S/P Procedure(s) (LRB): VIDEO ASSISTED THORACOSCOPY (VATS)/EMPYEMA (Left)  1. CV- NSR, mild HTN- continue Lisinopril 2. Pulm- no acute issues, CTs have been removed.. CXR is free from pneumothorax, post surgical changes,atelectasis, infiltrate remain present in left base 3. ID- Empyema, remains afebrile, no leukocytosis, OR cultures remain negative, will give Augmentin for discharge 4. DIspo-  patient stable, will d/c home today    Lowella Dandy 05/31/2018 7:47 AM   Patient examined and CXR reviewed DC instuctions reviewed withpatient OK for DC home patient examined and medical record reviewed,agree with above note. Kathlee Nations Trigt III 05/31/2018

## 2018-05-31 NOTE — Progress Notes (Signed)
Pt discharged with all belonging including clothes, cell phone and discharge instructions.  Pt educated about prescriptions and follow-up appointments and all questions answered.

## 2018-06-01 LAB — AEROBIC/ANAEROBIC CULTURE W GRAM STAIN (SURGICAL/DEEP WOUND): Culture: NO GROWTH

## 2018-06-01 LAB — CULTURE, BODY FLUID W GRAM STAIN -BOTTLE: Culture: NO GROWTH

## 2018-06-14 ENCOUNTER — Ambulatory Visit: Payer: Self-pay | Admitting: *Deleted

## 2018-06-14 DIAGNOSIS — Z09 Encounter for follow-up examination after completed treatment for conditions other than malignant neoplasm: Secondary | ICD-10-CM

## 2018-06-14 DIAGNOSIS — J869 Pyothorax without fistula: Secondary | ICD-10-CM

## 2018-06-14 DIAGNOSIS — Z4802 Encounter for removal of sutures: Secondary | ICD-10-CM

## 2018-06-14 NOTE — Progress Notes (Signed)
Mr. John Rodriguez is s/p L VATS, DRAINAGE OF EMPYEMA  05/27/18 with discharge on 05/31/18. His post operative course has been uneventful with minimal discomfort requiring no narcotics. Appetite and bowels are good. His left thoracotomy and previous two chest tube site incisions are all very well healed. The sutures were easily removed from the chest tube sites. He is on oral antibiotics and was encouraged to complete the entire prescription. He will return as scheduled with a CXR.

## 2018-06-17 LAB — CULTURE, FUNGUS WITHOUT SMEAR

## 2018-06-28 ENCOUNTER — Other Ambulatory Visit: Payer: Self-pay | Admitting: Cardiothoracic Surgery

## 2018-06-28 DIAGNOSIS — J90 Pleural effusion, not elsewhere classified: Secondary | ICD-10-CM

## 2018-06-28 LAB — FUNGUS CULTURE WITH STAIN

## 2018-06-28 LAB — FUNGAL ORGANISM REFLEX

## 2018-06-28 LAB — FUNGUS CULTURE RESULT

## 2018-06-29 ENCOUNTER — Other Ambulatory Visit: Payer: Self-pay

## 2018-06-29 ENCOUNTER — Encounter: Payer: Self-pay | Admitting: Cardiothoracic Surgery

## 2018-06-29 ENCOUNTER — Ambulatory Visit
Admission: RE | Admit: 2018-06-29 | Discharge: 2018-06-29 | Disposition: A | Discharge status: Home / Self Care | Payer: Self-pay | Source: Ambulatory Visit | Attending: Cardiothoracic Surgery | Admitting: Cardiothoracic Surgery

## 2018-06-29 ENCOUNTER — Ambulatory Visit (INDEPENDENT_AMBULATORY_CARE_PROVIDER_SITE_OTHER): Payer: Self-pay | Admitting: Cardiothoracic Surgery

## 2018-06-29 VITALS — BP 135/83 | HR 48 | Resp 16 | Ht 67.0 in | Wt 176.6 lb

## 2018-06-29 DIAGNOSIS — J90 Pleural effusion, not elsewhere classified: Secondary | ICD-10-CM

## 2018-06-29 DIAGNOSIS — Z09 Encounter for follow-up examination after completed treatment for conditions other than malignant neoplasm: Secondary | ICD-10-CM

## 2018-06-29 DIAGNOSIS — J869 Pyothorax without fistula: Secondary | ICD-10-CM

## 2018-06-29 NOTE — Progress Notes (Signed)
PCP is [name], Chales Abrahams, NP Referring Provider is [name], Chales Abrahams, NP  Chief Complaint  Patient presents with  . Routine Post Op    f/u from surgery with CXR s/p L (VATS)/EMPYEMA 05/27/18    HPI: 1 month follow-up after left VATS for drainage of empyema Patient doing well with minimal postthoracotomy pain. He is not smoking \\Chest  x-ray today shows clear left lung with some mild pleural thickening at the costophrenic angle Surgical incision well-healed. Not short of breath   History reviewed. No pertinent past medical history.  Past Surgical History:  Procedure Laterality Date  . VIDEO ASSISTED THORACOSCOPY (VATS)/EMPYEMA Left 05/27/2018   Procedure: VIDEO ASSISTED THORACOSCOPY (VATS)/EMPYEMA;  Surgeon: Kerin Perna, MD;  Location: [facility] OR;  Service: Thoracic;  Laterality: Left;    Family History  Problem Relation Age of Onset  . Prostate cancer Father     Social History Social History   Tobacco Use  . Smoking status: Current Every Day Smoker  . Smokeless tobacco: Never Used  Substance Use Topics  . Alcohol use: Yes    Comment: Occasionally.  . Drug use: Not on file    Current Outpatient Medications  Medication Sig Dispense Refill  . acetaminophen (TYLENOL) 500 MG tablet Take 2 tablets (1,000 mg total) by mouth every 6 (six) hours as needed for mild pain or fever. 30 tablet 0  . lisinopril (PRINIVIL,ZESTRIL) 5 MG tablet Take 1 tablet (5 mg total) by mouth daily. 30 tablet 3  . naproxen sodium (ALEVE) 220 MG tablet Take 220 mg by mouth 2 (two) times daily as needed (for pain).     No current facility-administered medications for this visit.     No Known Allergies  Review of Systems  No fever Strength and appetite improving Weight stable  BP 135/83 (BP Location: Left Arm, Patient Position: Sitting, Cuff Size: Large)   Pulse (!) 48   Resp 16   Ht 5\' 7"  (1.702 m)   Wt 176 lb 9.6 oz (80.1 kg)   SpO2 98% Comment: RA  BMI 27.66 kg/m  Physical Exam    Exam    General- alert and comfortable    Neck- no JVD, no cervical adenopathy palpable, no carotid bruit   Lungs- clear without rales, wheezes.  Left VATS incision well-healed.   Cor- regular rate and rhythm, no murmur , gallop   Abdomen- soft, non-tender   Extremities - warm, non-tender, minimal edema   Neuro- oriented, appropriate, no focal weakness   Diagnostic Tests: Chest x-ray clear  Impression: Excellent recovery after left VATS for drainage of empyema. Nothing cultured from pleural material-patient does not need any more antibiotic.  Plan: Return in 8 weeks with chest x-ray for final evaluation  Patient cleared to drive do normal activities but to avoid heavy lifting or strenuous activities.  Should be able to return to work in October.  Mikey Bussing, MD [facility] [phone]

## 2018-07-04 LAB — ACID FAST CULTURE WITH REFLEXED SENSITIVITIES (MYCOBACTERIA): Acid Fast Culture: NEGATIVE

## 2018-07-10 LAB — ACID FAST CULTURE WITH REFLEXED SENSITIVITIES (MYCOBACTERIA)
Acid Fast Culture: NEGATIVE
Acid Fast Culture: NEGATIVE

## 2018-08-30 ENCOUNTER — Other Ambulatory Visit: Payer: Self-pay | Admitting: Cardiothoracic Surgery

## 2018-08-30 DIAGNOSIS — J90 Pleural effusion, not elsewhere classified: Secondary | ICD-10-CM

## 2018-08-31 ENCOUNTER — Ambulatory Visit
Admission: RE | Admit: 2018-08-31 | Discharge: 2018-08-31 | Disposition: A | Discharge status: Home / Self Care | Payer: Self-pay | Source: Ambulatory Visit | Attending: Cardiothoracic Surgery | Admitting: Cardiothoracic Surgery

## 2018-08-31 ENCOUNTER — Ambulatory Visit (INDEPENDENT_AMBULATORY_CARE_PROVIDER_SITE_OTHER): Payer: Self-pay | Admitting: Cardiothoracic Surgery

## 2018-08-31 ENCOUNTER — Encounter: Payer: Self-pay | Admitting: Cardiothoracic Surgery

## 2018-08-31 ENCOUNTER — Other Ambulatory Visit: Payer: Self-pay

## 2018-08-31 VITALS — BP 132/90 | HR 73 | Resp 16 | Ht 67.0 in | Wt 171.0 lb

## 2018-08-31 DIAGNOSIS — Z09 Encounter for follow-up examination after completed treatment for conditions other than malignant neoplasm: Secondary | ICD-10-CM

## 2018-08-31 DIAGNOSIS — J90 Pleural effusion, not elsewhere classified: Secondary | ICD-10-CM

## 2018-08-31 DIAGNOSIS — J869 Pyothorax without fistula: Secondary | ICD-10-CM

## 2018-08-31 NOTE — Progress Notes (Signed)
PCP is John, [name], NP Referring Provider is John, [name], NP  Chief Complaint  Patient presents with  . Routine Post Op    2 month f/u with a CXR    HPI: Patient returns for follow-up almost 3 months after left VATS for drainage of empyema.  Pathology showed no malignancy.  6-week AFB cultures were negative.  Chest x-ray taken today shows clear lungs no residual pleural effusion. Patient denies significant chest pain or breathing problems.  He is ready to return to work.  Return to work document provided patient.  History reviewed. No pertinent past medical history.  Past Surgical History:  Procedure Laterality Date  . VIDEO ASSISTED THORACOSCOPY (VATS)/EMPYEMA Left 05/27/2018   Procedure: VIDEO ASSISTED THORACOSCOPY (VATS)/EMPYEMA;  Surgeon: Kerin Perna, MD;  Location: [facility] OR;  Service: Thoracic;  Laterality: Left;    Family History  Problem Relation Age of Onset  . Prostate cancer Father     Social History Social History   Tobacco Use  . Smoking status: Current Every Day Smoker  . Smokeless tobacco: Never Used  Substance Use Topics  . Alcohol use: Yes    Comment: Occasionally.  . Drug use: Not on file    Current Outpatient Medications  Medication Sig Dispense Refill  . acetaminophen (TYLENOL) 500 MG tablet Take 2 tablets (1,000 mg total) by mouth every 6 (six) hours as needed for mild pain or fever. 30 tablet 0  . naproxen sodium (ALEVE) 220 MG tablet Take 220 mg by mouth 2 (two) times daily as needed (for pain).    Marland Kitchen lisinopril (PRINIVIL,ZESTRIL) 5 MG tablet Take 1 tablet (5 mg total) by mouth daily. (Patient not taking: Reported on 08/31/2018) 30 tablet 3   No current facility-administered medications for this visit.     No Known Allergies  Review of Systems  Improved strength in appetite no shortness of breath  BP 132/90 (BP Location: Right Arm, Patient Position: Sitting, Cuff Size: Large)   Pulse 73   Resp 16   Ht 5\' 7"  (1.702 m)   Wt 171 lb  (77.6 kg)   SpO2 97% Comment: ON RA  BMI 26.78 kg/m  Physical Exam Alert and comfortable Incision well-healed left chest Breath sounds clear and equal bilaterally Heart rhythm regular Neuro intact  Diagnostic Tests: Chest x-ray clear  Impression: Patient is recovered from left VATS for drainage of empyema. He is recommended to continue to be smoke-free and to have a flu vaccine. Plan: Patient return as needed.  No prescriptions provided at this visit.   Mikey Bussing, MD [facility] [phone]

## 2019-08-11 IMAGING — US US THYROID
1 series · 13 of 25 positions shown · non-contrast
Comparison: CT of the chest on 05/21/2018

CLINICAL DATA: Incidental on CT. Left thyroid nodule detected by
CT.

EXAM:
THYROID ULTRASOUND
TECHNIQUE: Ultrasound examination of the thyroid gland and adjacent soft
tissues was performed.

[Series 1: us thyroid · 0.07mm/px · 13 of 41 slices shown]
[im 1/41]
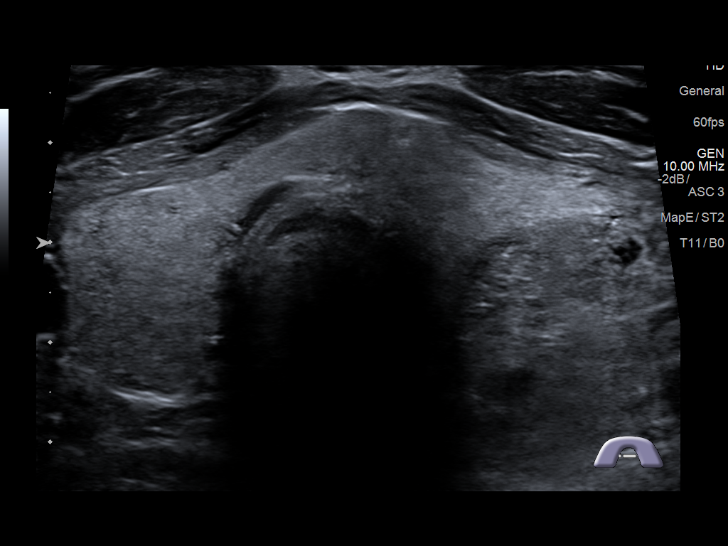
[im 4/41]
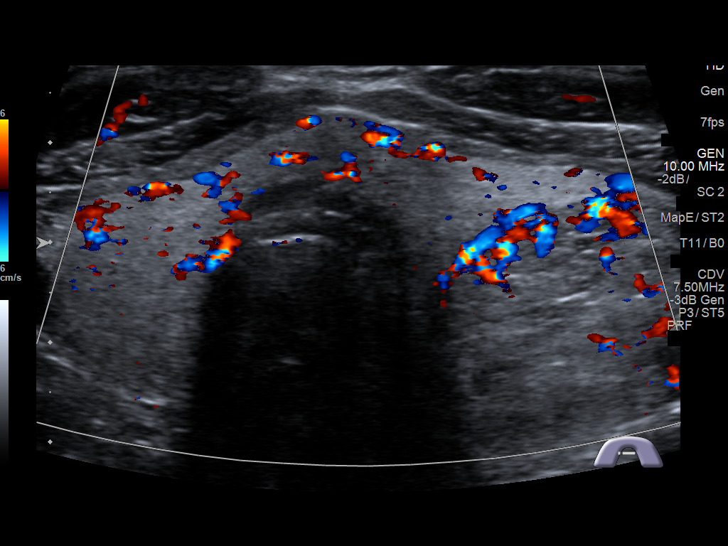
[im 7/41]
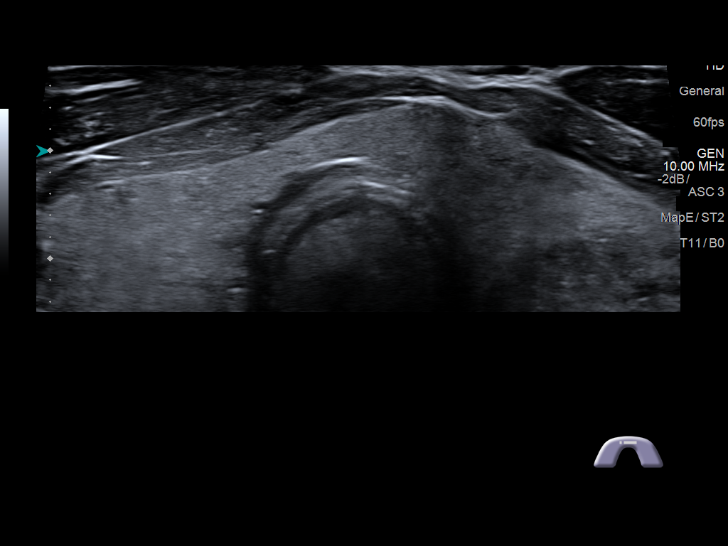
[im 11/41]
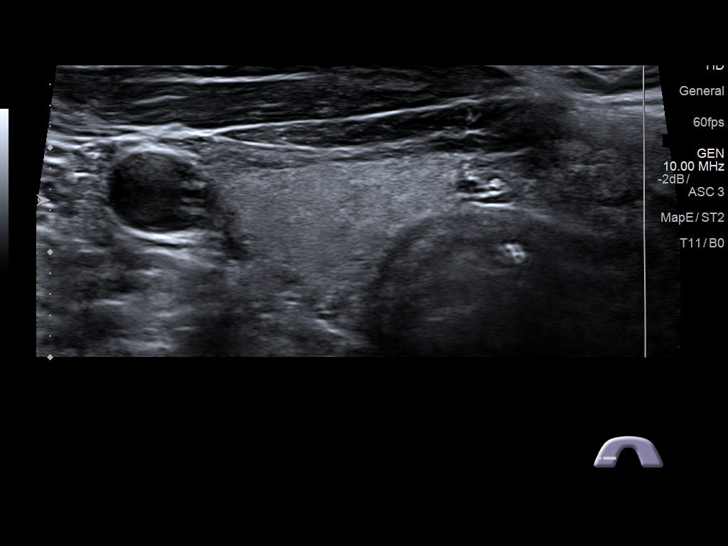
[im 14/41]
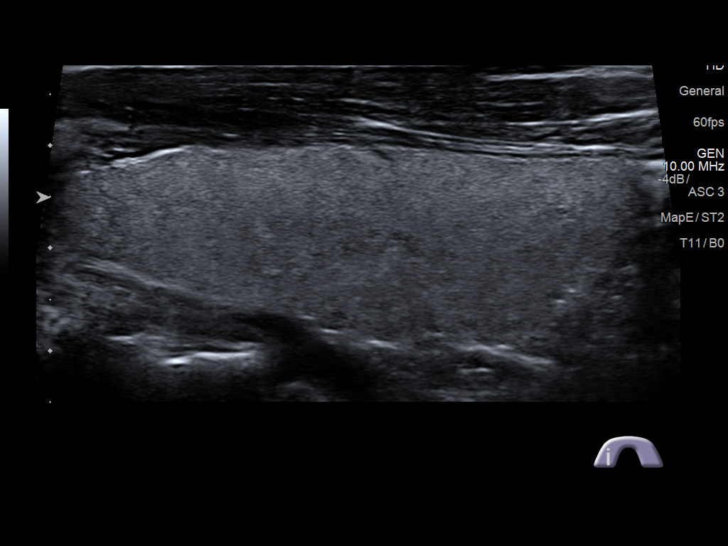
[im 17/41]
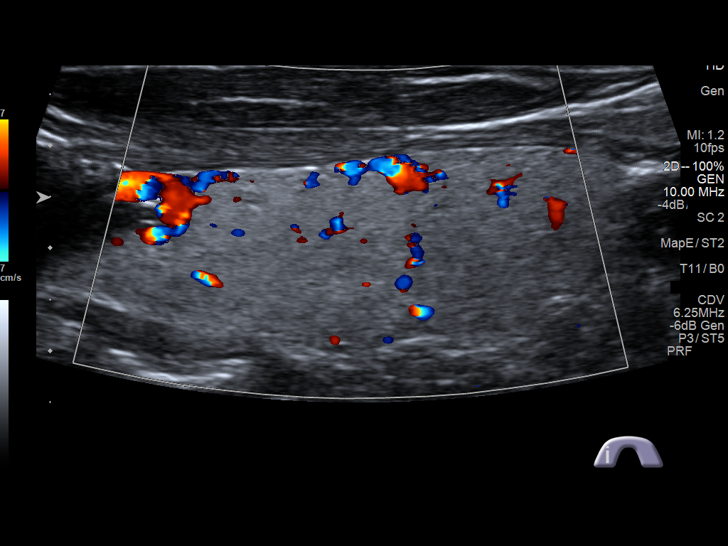
[im 21/41]
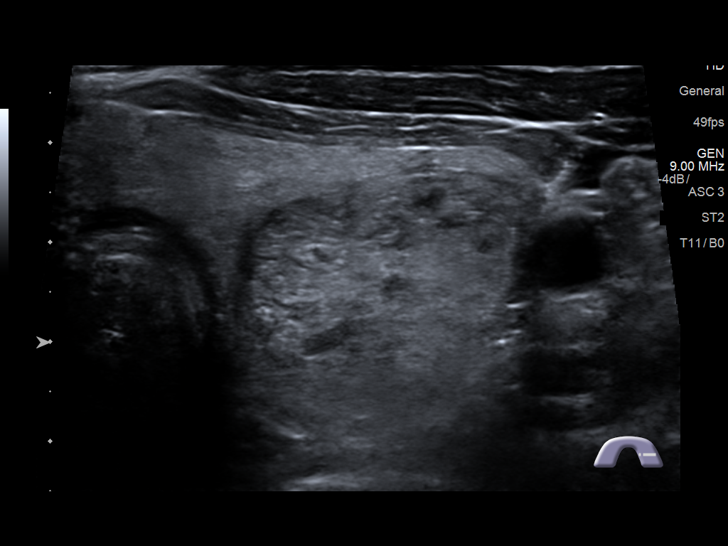
[im 24/41]
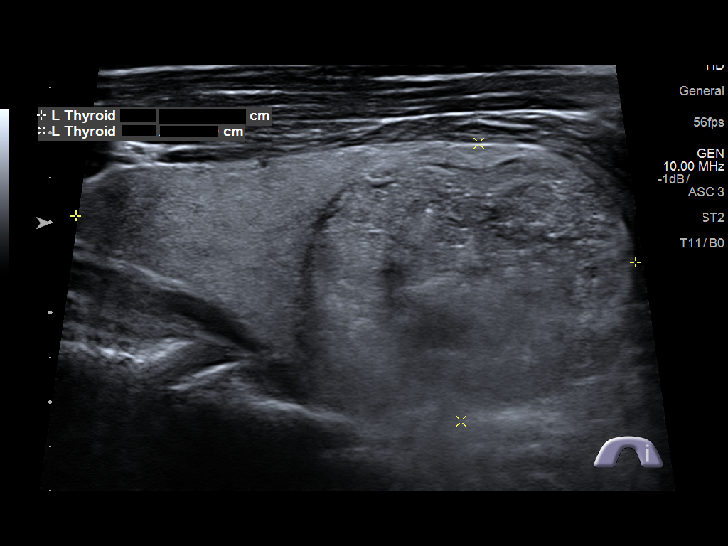
[im 27/41]
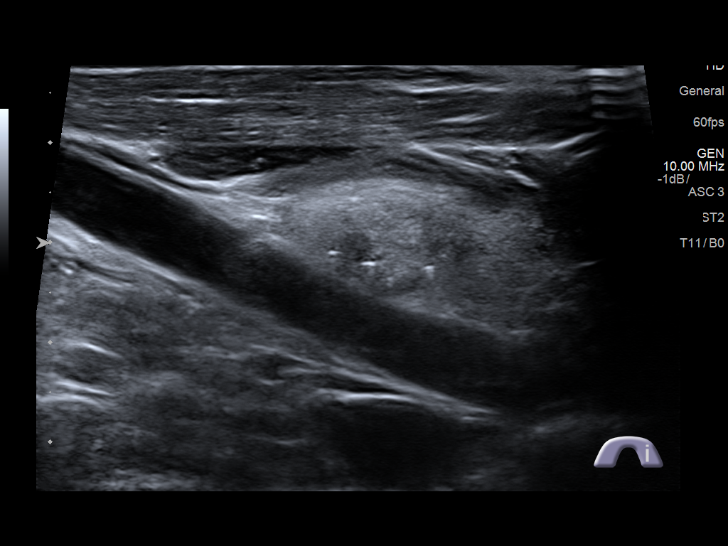
[im 31/41]
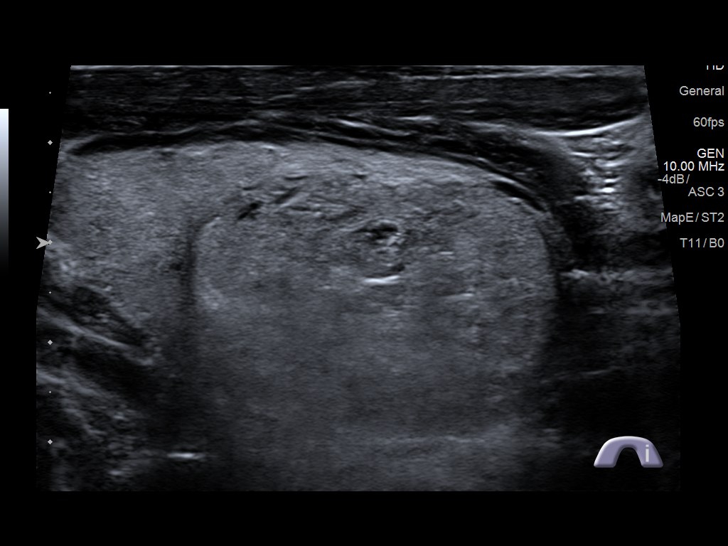
[im 34/41]
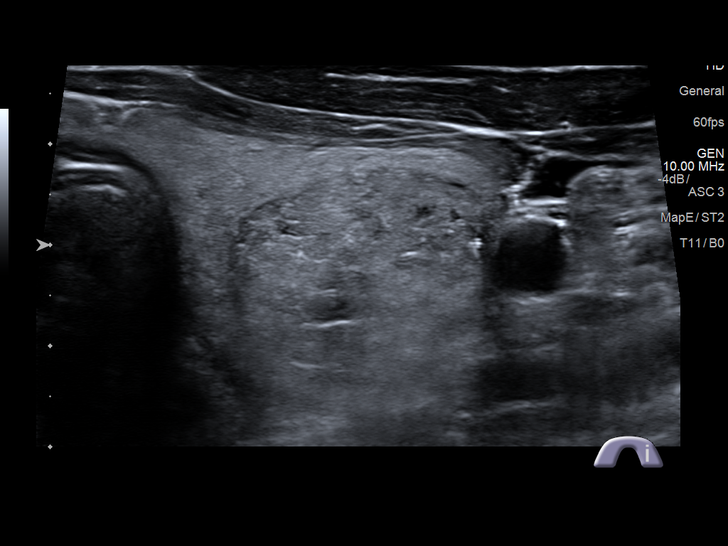
[im 37/41]
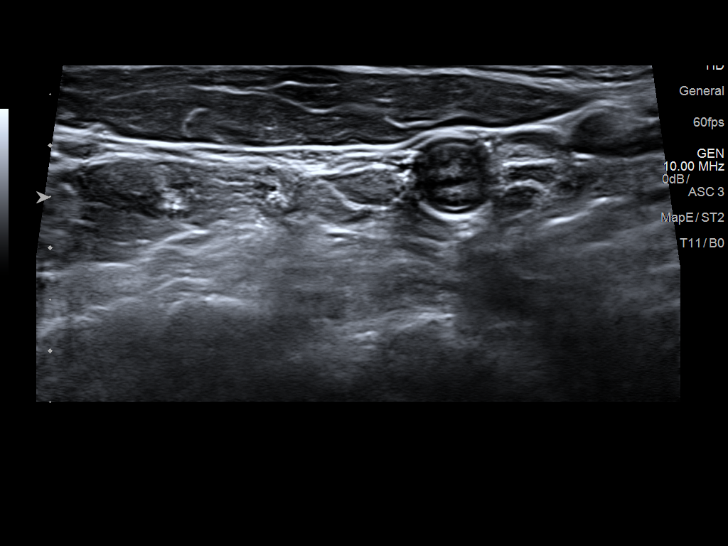
[im 41/41]
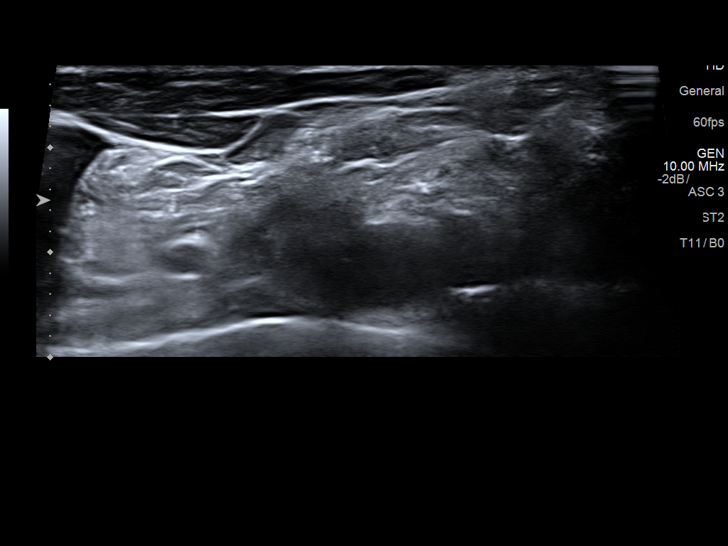

[13 of 25 positions shown; findings below may reference images not displayed]

FINDINGS: Parenchymal Echotexture: Normal

Isthmus: 0.5 cm

Right lobe: 5.8 x 1.9 x 2.1 cm

Left lobe: 6.3 x 3.1 x 3.1 cm

_________________________________________________________

Estimated total number of nodules >/= 1 cm: 1

Number of spongiform nodules >/=  2 cm not described below (TR1): 0

Number of mixed cystic and solid nodules >/= 1.5 cm not described
below (TR2): 0

_________________________________________________________

Nodule # 1:

Location: Left; Inferior

Maximum size: 3.7 cm; Other 2 dimensions: 2.4 x 2.8 cm

Composition: solid/almost completely solid (2)

Echogenicity: isoechoic (1)

Shape: not taller-than-wide (0)

Margins: smooth (0)

Echogenic foci: none (0)

ACR TI-RADS total points: 3.

ACR TI-RADS risk category: TR3 (3 points).

ACR TI-RADS recommendations:

**Given size (>/= 2.5 cm) and appearance, fine needle aspiration of
this mildly suspicious nodule should be considered based on TI-RADS
criteria.

_________________________________________________________

No enlarged lymph nodes identified.
IMPRESSION: Solitary left inferior thyroid nodule measuring 3.7 x 2.4 x 2.8 cm.
This nodule meets TI-RADS criteria for elective fine-needle
aspiration.

The above is in keeping with the ACR TI-RADS recommendations - [HOSPITAL] 5299;[DATE].

## 2019-09-20 IMAGING — DX DG CHEST 2V
2 series · 2 of 2 positions shown · non-contrast
Comparison: None.

CLINICAL DATA: Left side chest pain

EXAM:
CHEST - 2 VIEW

[chest pa]
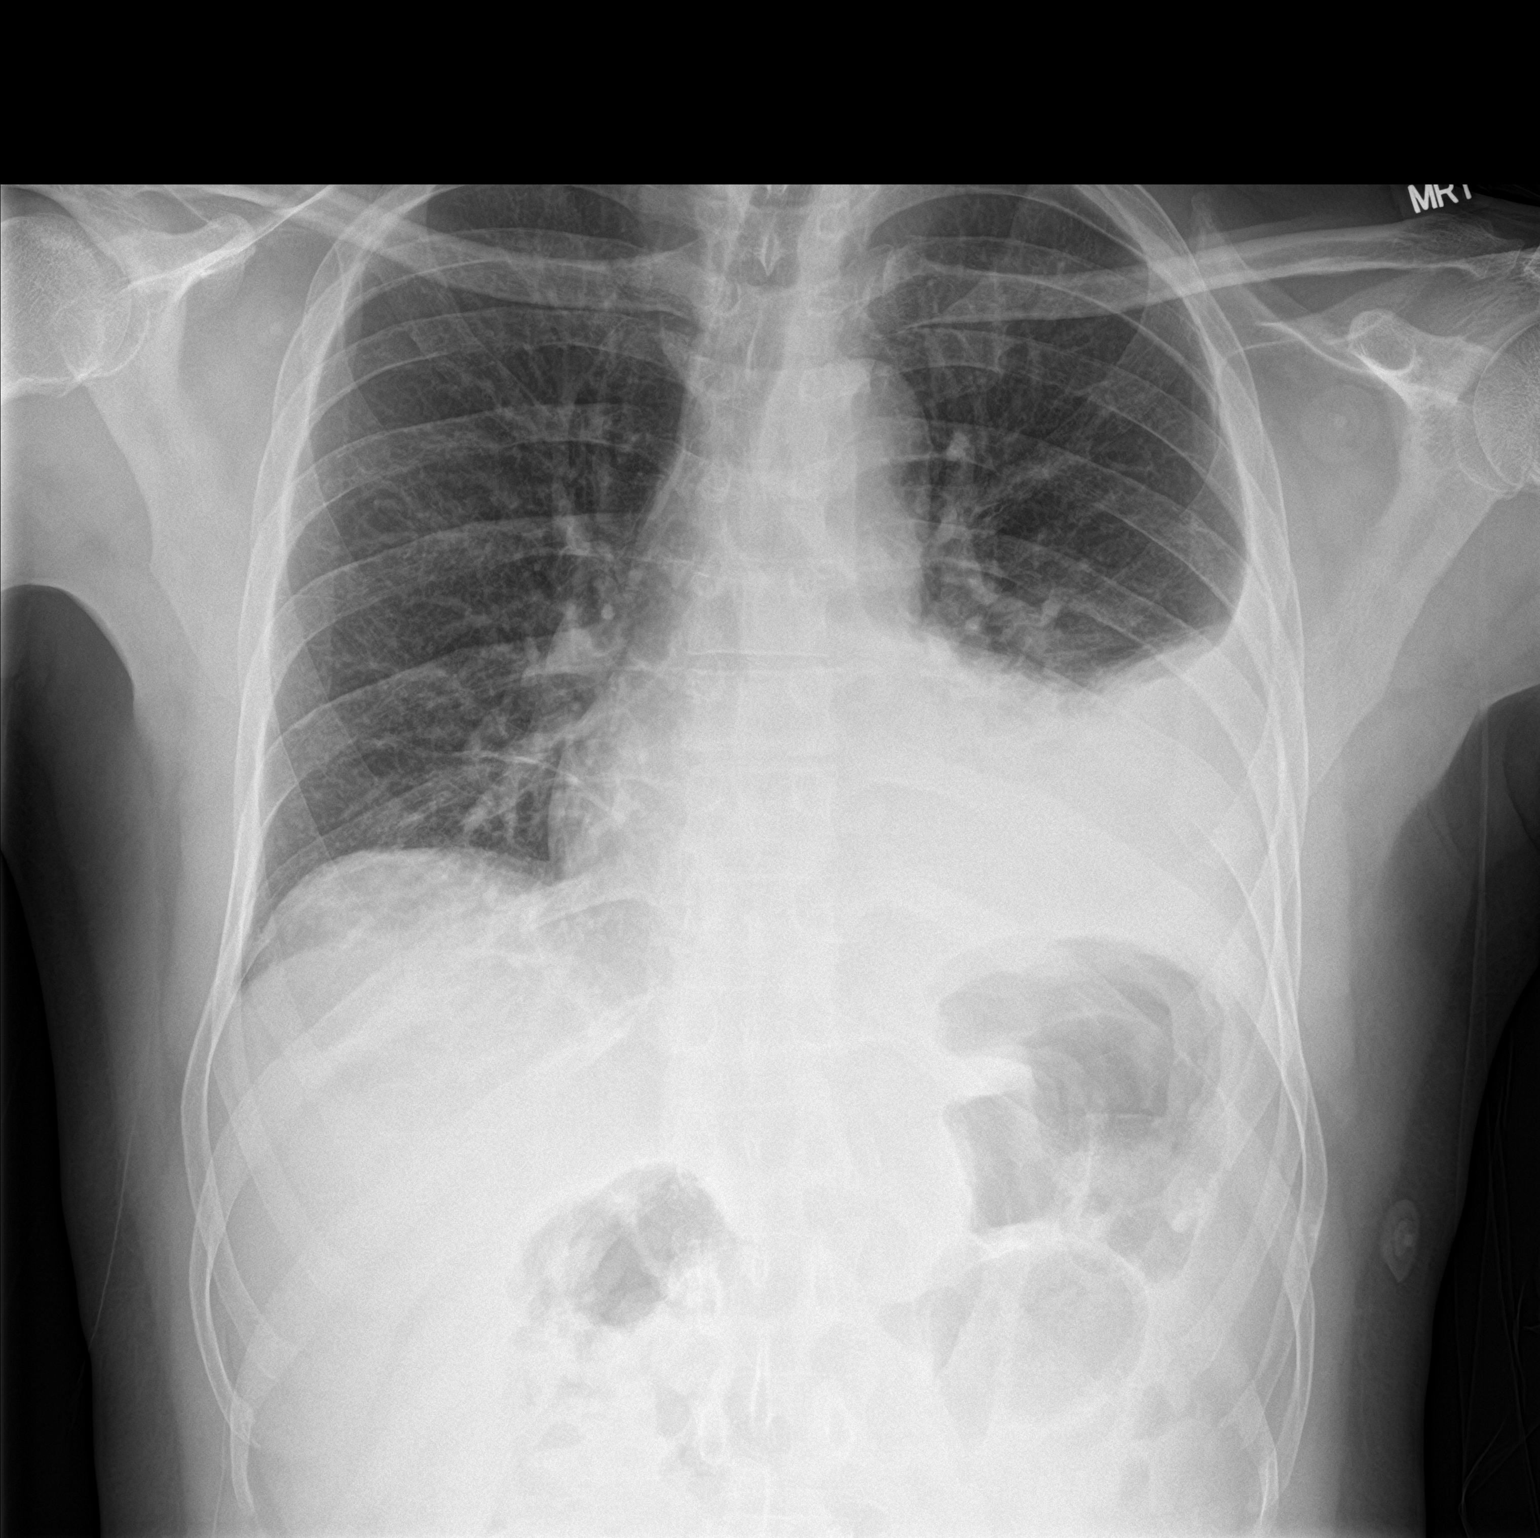

[chest lat]
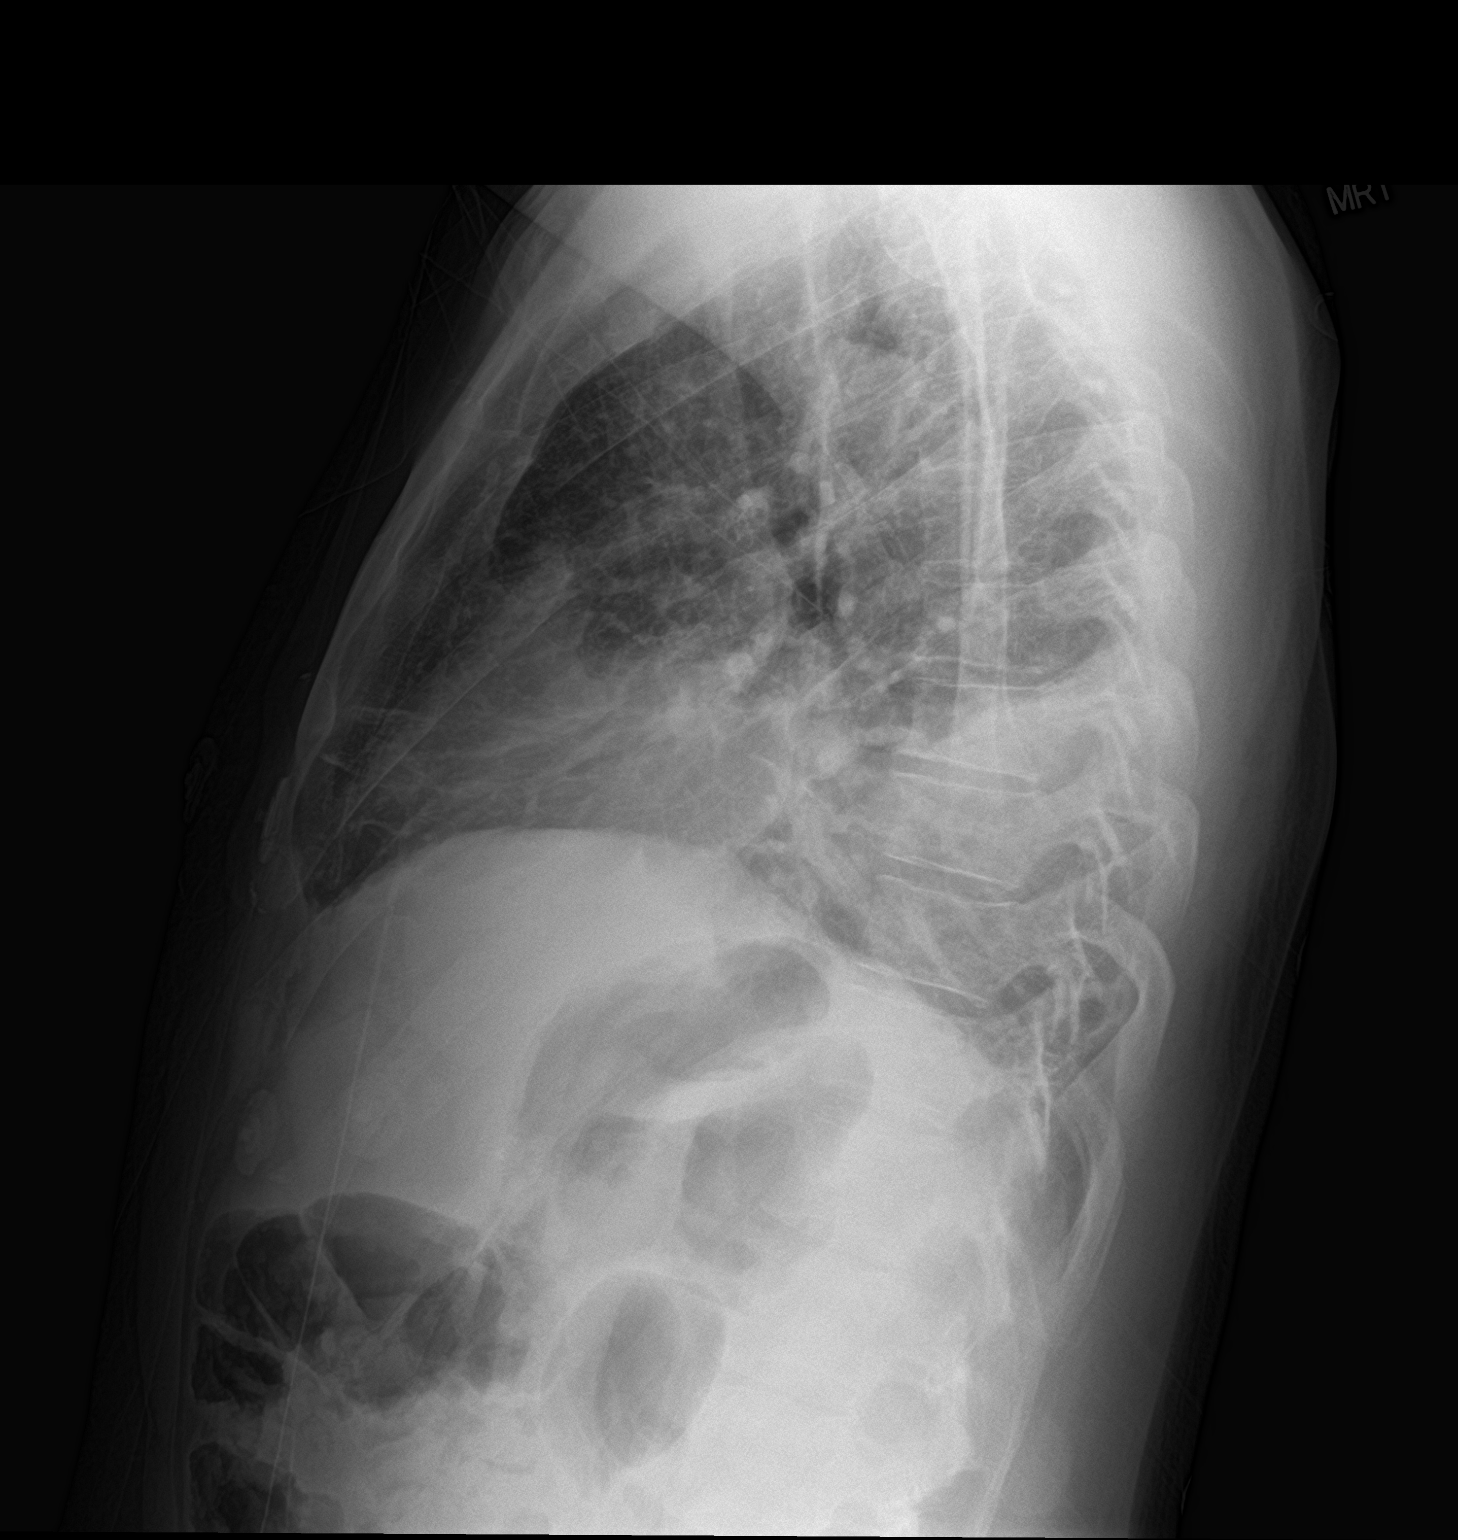

[2 of 2 positions shown; findings below may reference images not displayed]

FINDINGS: Large left pleural effusion with left base atelectasis. No confluent
opacity on the right. Heart is mildly enlarged. No acute bony
abnormality.
IMPRESSION: Large left pleural effusion with left base atelectasis.

## 2019-09-23 IMAGING — DX DG CHEST 1V PORT
1 series · 1 of 1 positions shown · non-contrast
Comparison: 05/24/2018

CLINICAL DATA: Post left thoracentesis

EXAM:
PORTABLE CHEST 1 VIEW

[chest ap]
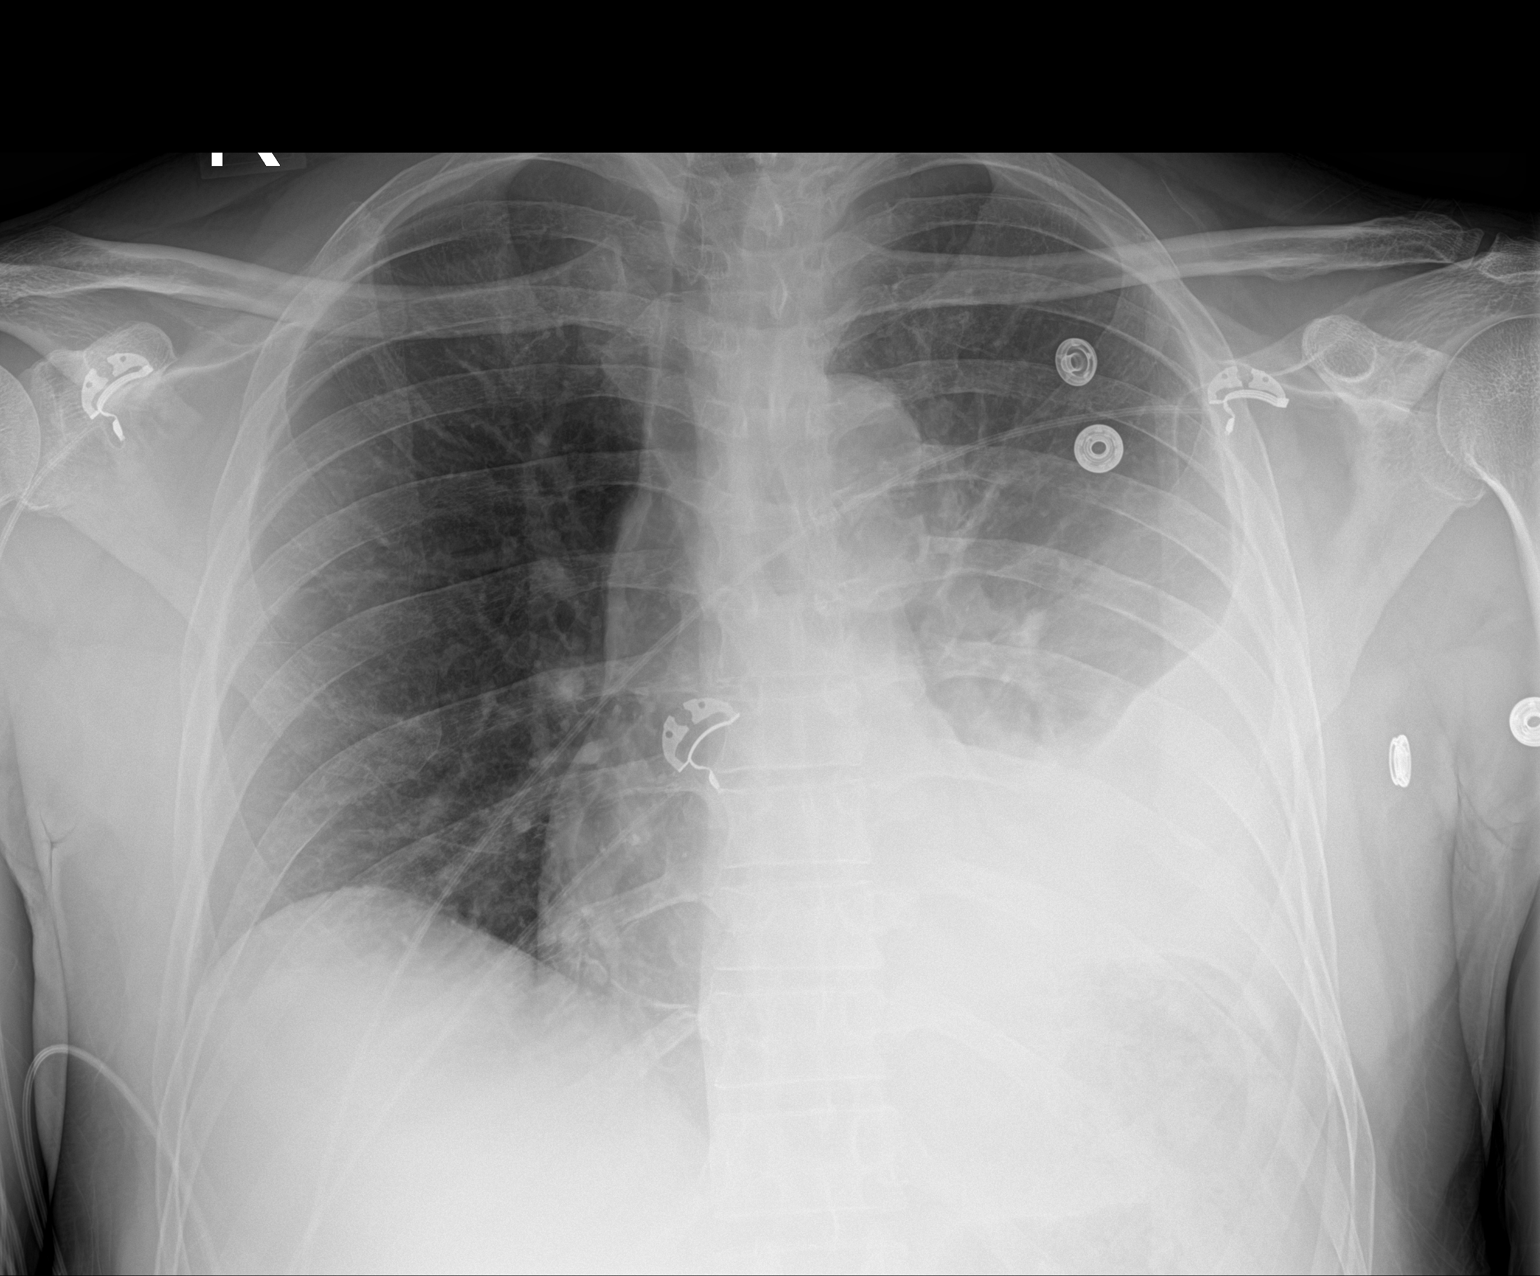

[1 of 1 positions shown; findings below may reference images not displayed]

FINDINGS: No significant change in left pleural effusion which is moderately
large. Left lower lobe consolidation unchanged.

Negative for pneumothorax post left thoracentesis

Right lung remains clear
IMPRESSION: Negative for pneumothorax post left thoracentesis. Little change in
moderate large left effusion and left lower lobe consolidation.

## 2019-09-23 IMAGING — CR DG CHEST 2V
2 series · 2 of 2 positions shown · non-contrast
Comparison: May 22, 2018

CLINICAL DATA: Left-sided chest pain.  Shortness of breath.

EXAM:
CHEST - 2 VIEW

[chest pa]
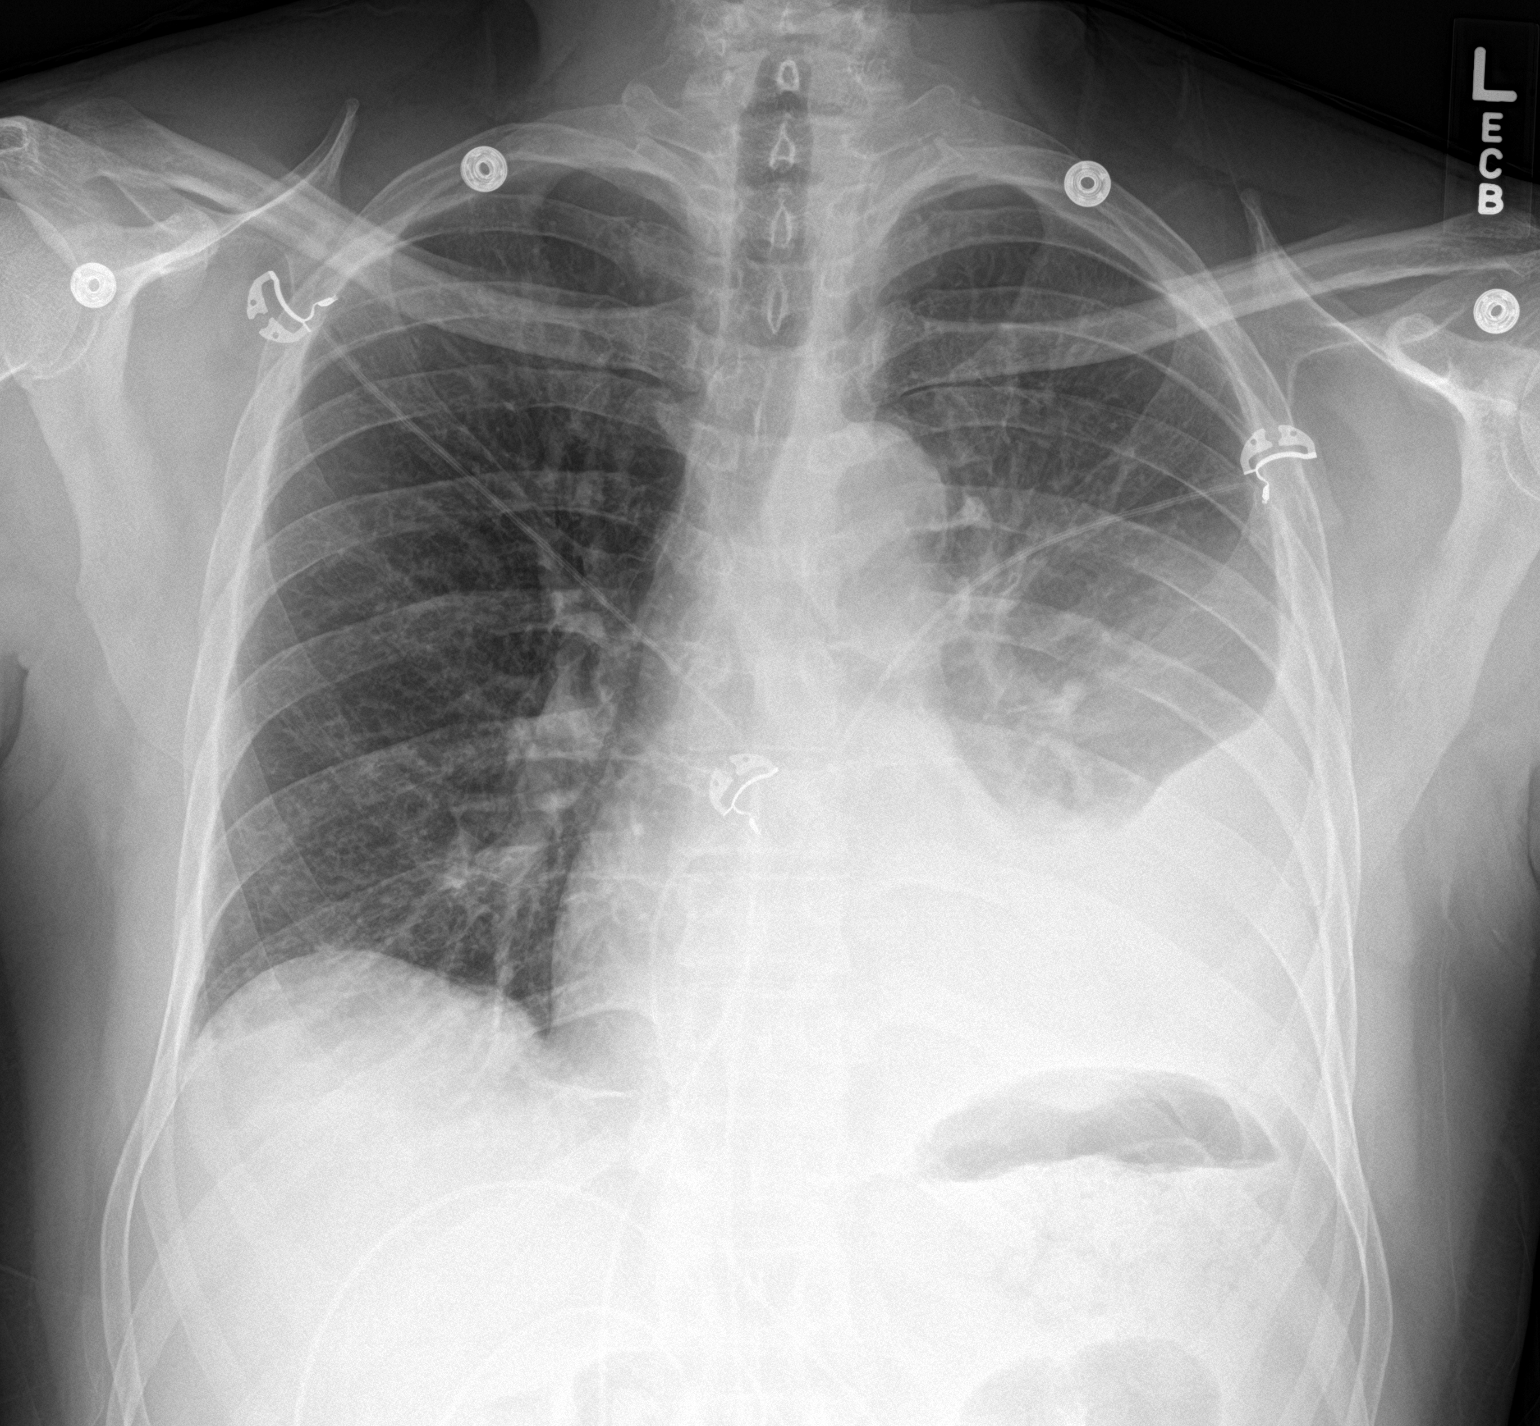

[chest lat]
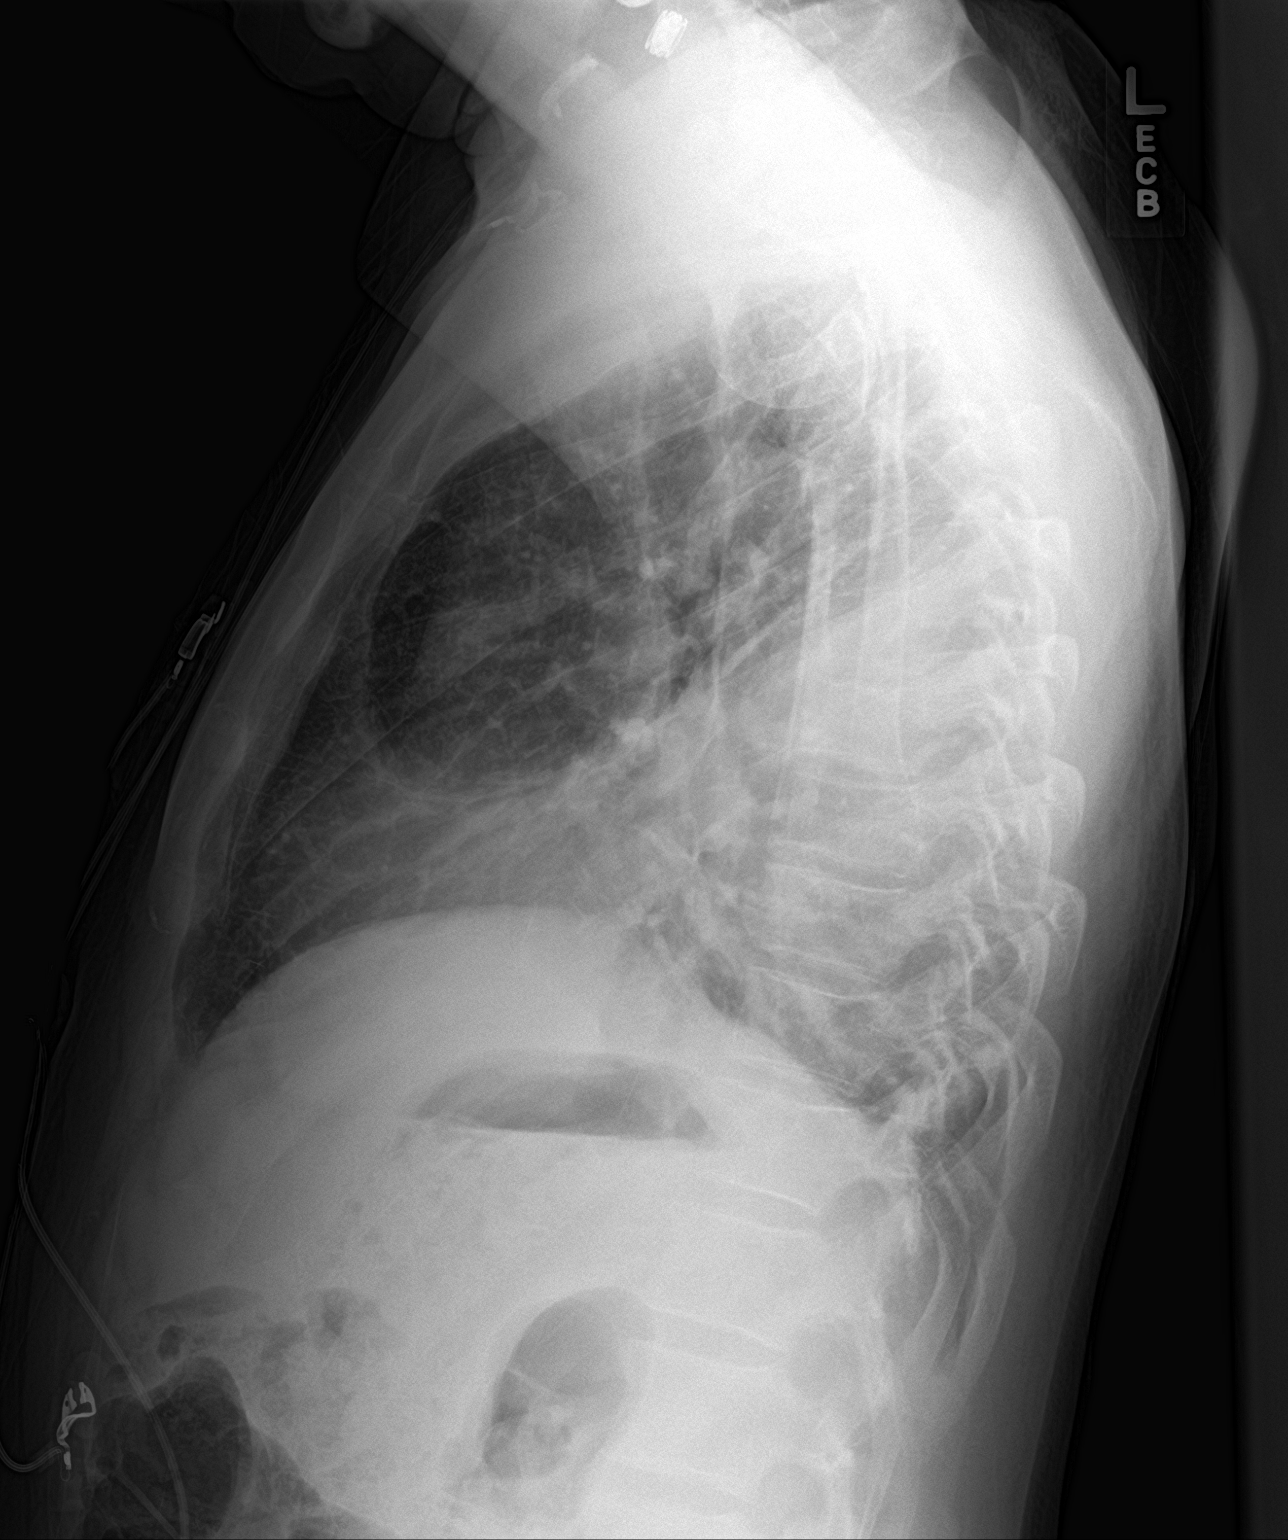

[2 of 2 positions shown; findings below may reference images not displayed]

FINDINGS: There is a moderate left-sided pleural effusion with underlying
opacity, similar to slightly more prominent the interval. No
pneumothorax. No other interval changes.
IMPRESSION: Moderate left-sided pleural effusion, similar to slightly larger in
the interval. No other interval change.

## 2020-10-23 ENCOUNTER — Other Ambulatory Visit: Payer: Self-pay

## 2020-10-23 ENCOUNTER — Encounter (HOSPITAL_COMMUNITY): Payer: Self-pay

## 2020-10-23 ENCOUNTER — Emergency Department (HOSPITAL_COMMUNITY)
Admission: EM | Admit: 2020-10-23 | Discharge: 2020-10-23 | Disposition: A | Discharge status: Home / Self Care | Payer: Self-pay | Attending: Emergency Medicine | Admitting: Emergency Medicine

## 2020-10-23 DIAGNOSIS — Z5321 Procedure and treatment not carried out due to patient leaving prior to being seen by health care provider: Secondary | ICD-10-CM | POA: Insufficient documentation

## 2020-10-23 DIAGNOSIS — F10929 Alcohol use, unspecified with intoxication, unspecified: Secondary | ICD-10-CM | POA: Insufficient documentation

## 2020-10-23 NOTE — ED Triage Notes (Signed)
Pt BIB EMS. Pt as found in someone's front yard. Pt appears to be intoxicated and admits to drinking. Pt A&O x2.   102/60 HR 80 98% RA CBG 84

## 2021-02-25 ENCOUNTER — Other Ambulatory Visit: Payer: Self-pay

## 2021-02-25 ENCOUNTER — Encounter (HOSPITAL_COMMUNITY): Payer: Self-pay

## 2021-02-25 ENCOUNTER — Emergency Department (HOSPITAL_COMMUNITY): Payer: Self-pay

## 2021-02-25 ENCOUNTER — Observation Stay (HOSPITAL_COMMUNITY)
Admission: EM | Admit: 2021-02-25 | Discharge: 2021-02-27 | Disposition: A | Discharge status: Home / Self Care | Payer: Self-pay | Attending: Internal Medicine | Admitting: Internal Medicine

## 2021-02-25 DIAGNOSIS — F1010 Alcohol abuse, uncomplicated: Secondary | ICD-10-CM | POA: Insufficient documentation

## 2021-02-25 DIAGNOSIS — S0101XA Laceration without foreign body of scalp, initial encounter: Principal | ICD-10-CM | POA: Diagnosis present

## 2021-02-25 DIAGNOSIS — Y900 Blood alcohol level of less than 20 mg/100 ml: Secondary | ICD-10-CM | POA: Insufficient documentation

## 2021-02-25 DIAGNOSIS — R55 Syncope and collapse: Secondary | ICD-10-CM

## 2021-02-25 DIAGNOSIS — Z20822 Contact with and (suspected) exposure to covid-19: Secondary | ICD-10-CM | POA: Insufficient documentation

## 2021-02-25 DIAGNOSIS — R7989 Other specified abnormal findings of blood chemistry: Secondary | ICD-10-CM

## 2021-02-25 DIAGNOSIS — R4182 Altered mental status, unspecified: Secondary | ICD-10-CM | POA: Insufficient documentation

## 2021-02-25 DIAGNOSIS — D72829 Elevated white blood cell count, unspecified: Secondary | ICD-10-CM | POA: Insufficient documentation

## 2021-02-25 DIAGNOSIS — F17200 Nicotine dependence, unspecified, uncomplicated: Secondary | ICD-10-CM | POA: Insufficient documentation

## 2021-02-25 DIAGNOSIS — N179 Acute kidney failure, unspecified: Secondary | ICD-10-CM | POA: Diagnosis present

## 2021-02-25 DIAGNOSIS — W01198A Fall on same level from slipping, tripping and stumbling with subsequent striking against other object, initial encounter: Secondary | ICD-10-CM | POA: Insufficient documentation

## 2021-02-25 HISTORY — DX: Other problems related to lifestyle: Z72.89

## 2021-02-25 LAB — CBG MONITORING, ED: Glucose-Capillary: 130 mg/dL — ABNORMAL HIGH (ref 70–99)

## 2021-02-25 LAB — COMPREHENSIVE METABOLIC PANEL
ALT: 31 U/L (ref 0–44)
AST: 47 U/L — ABNORMAL HIGH (ref 15–41)
Albumin: 3.9 g/dL (ref 3.5–5.0)
Alkaline Phosphatase: 42 U/L (ref 38–126)
Anion gap: 14 (ref 5–15)
BUN: 5 mg/dL — ABNORMAL LOW (ref 6–20)
CO2: 19 mmol/L — ABNORMAL LOW (ref 22–32)
Calcium: 8.6 mg/dL — ABNORMAL LOW (ref 8.9–10.3)
Chloride: 104 mmol/L (ref 98–111)
Creatinine, Ser: 1.47 mg/dL — ABNORMAL HIGH (ref 0.61–1.24)
GFR, Estimated: 56 mL/min — ABNORMAL LOW (ref >60)
Glucose, Bld: 175 mg/dL — ABNORMAL HIGH (ref 70–99)
Potassium: 4.6 mmol/L (ref 3.5–5.1)
Sodium: 137 mmol/L (ref 135–145)
Total Bilirubin: 1.2 mg/dL (ref 0.3–1.2)
Total Protein: 6.3 g/dL — ABNORMAL LOW (ref 6.5–8.1)

## 2021-02-25 LAB — RAPID URINE DRUG SCREEN, HOSP PERFORMED
Amphetamines: NOT DETECTED
Barbiturates: NOT DETECTED
Benzodiazepines: NOT DETECTED
Cocaine: NOT DETECTED
Opiates: NOT DETECTED
Tetrahydrocannabinol: POSITIVE — AB

## 2021-02-25 LAB — URINALYSIS, COMPLETE (UACMP) WITH MICROSCOPIC
Bilirubin Urine: NEGATIVE
Glucose, UA: 50 mg/dL — AB
Ketones, ur: 5 mg/dL — AB
Leukocytes,Ua: NEGATIVE
Nitrite: NEGATIVE
Protein, ur: 30 mg/dL — AB
Specific Gravity, Urine: 1.012 (ref 1.005–1.030)
pH: 5 (ref 5.0–8.0)

## 2021-02-25 LAB — CBC WITH DIFFERENTIAL/PLATELET
Abs Immature Granulocytes: 0.55 10*3/uL — ABNORMAL HIGH (ref 0.00–0.07)
Basophils Absolute: 0.1 10*3/uL (ref 0.0–0.1)
Basophils Relative: 1 %
Eosinophils Absolute: 0.1 10*3/uL (ref 0.0–0.5)
Eosinophils Relative: 1 %
HCT: 47.7 % (ref 39.0–52.0)
Hemoglobin: 15.1 g/dL (ref 13.0–17.0)
Immature Granulocytes: 3 %
Lymphocytes Relative: 14 %
Lymphs Abs: 2.3 10*3/uL (ref 0.7–4.0)
MCH: 31.5 pg (ref 26.0–34.0)
MCHC: 31.7 g/dL (ref 30.0–36.0)
MCV: 99.4 fL (ref 80.0–100.0)
Monocytes Absolute: 1 10*3/uL (ref 0.1–1.0)
Monocytes Relative: 6 %
Neutro Abs: 12.5 10*3/uL — ABNORMAL HIGH (ref 1.7–7.7)
Neutrophils Relative %: 75 %
Platelets: 274 10*3/uL (ref 150–400)
RBC: 4.8 MIL/uL (ref 4.22–5.81)
RDW: 13.4 % (ref 11.5–15.5)
WBC: 16.6 10*3/uL — ABNORMAL HIGH (ref 4.0–10.5)
nRBC: 0 % (ref 0.0–0.2)

## 2021-02-25 LAB — TROPONIN I (HIGH SENSITIVITY)
Troponin I (High Sensitivity): 13 ng/L (ref <18)
Troponin I (High Sensitivity): 45 ng/L — ABNORMAL HIGH (ref <18)
Troponin I (High Sensitivity): 46 ng/L — ABNORMAL HIGH (ref <18)

## 2021-02-25 LAB — ETHANOL: Alcohol, Ethyl (B): 55 mg/dL — ABNORMAL HIGH (ref <10)

## 2021-02-25 LAB — PROTIME-INR
INR: 1 (ref 0.8–1.2)
Prothrombin Time: 13.5 seconds (ref 11.4–15.2)

## 2021-02-25 LAB — AMMONIA: Ammonia: 85 umol/L — ABNORMAL HIGH (ref 9–35)

## 2021-02-25 MED ORDER — ACETAMINOPHEN 325 MG PO TABS: 650.0000 mg | ORAL_TABLET | Freq: Four times a day (QID) | ORAL | Status: DC | PRN

## 2021-02-25 MED ORDER — LORAZEPAM 2 MG/ML IJ SOLN
1.0000 mg | Freq: Once | INTRAMUSCULAR | Status: AC
  Administered 2021-02-25: 1 mg via INTRAVENOUS
  Filled 2021-02-25: qty 1

## 2021-02-25 MED ORDER — ONDANSETRON HCL 4 MG/2ML IJ SOLN: 4.0000 mg | Freq: Four times a day (QID) | INTRAMUSCULAR | Status: DC | PRN

## 2021-02-25 MED ORDER — SODIUM CHLORIDE 0.9 % IV BOLUS
1000.0000 mL | Freq: Once | INTRAVENOUS | Status: AC
  Administered 2021-02-25: 1000 mL via INTRAVENOUS

## 2021-02-25 MED ORDER — ONDANSETRON HCL 4 MG PO TABS: 4.0000 mg | ORAL_TABLET | Freq: Four times a day (QID) | ORAL | Status: DC | PRN

## 2021-02-25 MED ORDER — LACTATED RINGERS IV SOLN: INTRAVENOUS | Status: AC

## 2021-02-25 MED ORDER — ACETAMINOPHEN 650 MG RE SUPP: 650.0000 mg | Freq: Four times a day (QID) | RECTAL | Status: DC | PRN

## 2021-02-25 MED ORDER — SODIUM CHLORIDE 0.9% FLUSH
3.0000 mL | Freq: Two times a day (BID) | INTRAVENOUS | Status: DC
  Administered 2021-02-26 (×2): 3 mL via INTRAVENOUS

## 2021-02-25 NOTE — H&P (Signed)
History and Physical    John Rodriguez SKA:[id number] DOB: 27-Mar-1966 DOA: 02/25/2021  PCP: Marliss Coots, NP  Patient coming from: Home via EMS  I have personally briefly reviewed patient's old medical records in [place]  Chief Complaint: Syncopal episode  HPI: John Rodriguez is a 55 y.o. male with medical history significant for tobacco and alcohol use who presents to the ED for evaluation of syncopal episode.  History is somewhat limited from patient as he does not recall the events and is otherwise supplemented by EDP and chart review.  Patient was seen outside drinking beer earlier today.  He went to stand up any suddenly fell backwards and struck his head.  He was unresponsive.  EMS were called.  Fire department arrived first on scene and he had patient was given 2 mg Narcan intranasally.  On EMS arrival he was given 1 mg Narcan.  He did not have significant immediate response and required assisted respirations for approximately 10 minutes.  On route to the ED he began to become more alert.  On arrival to the ED he was awake but unable to communicate appropriately.  He does not remember what happened at the time of his fall or afterwards.  He cannot quantify amount of alcohol he drinks but says he does not drink on a daily basis.  He has felt nauseous but denies emesis.  He denies any headache or change in vision.  He has not had any chest pain or dyspnea.  He says he is not currently taking any medications.  He denies any prior history of syncope.  He is a current smoker and says he smokes less than 1 pack/day.  He says he lives with his uncle.  ED Course:  Initial vitals showed BP 118/78, pulse 98, RR 25, temp 97.6 F, SPO2 90% on room air.  Patient placed on 2 L of supplemental O2 with SPO2 99%.  Labs show sodium 137, potassium 4.6, bicarb 19, BUN 5, creatinine 1.47, serum glucose 175, AST 47, ALT 31, alk phos 42, total bilirubin 1.2, ammonia 85, serum ethanol 55, WBC  16.6, hemoglobin 15.1, platelets 274,000, high-sensitivity troponin I 13.  Portable chest x-ray negative for focal consolidation, edema, or effusion.  CT head without contrast shows occipital scalp hematoma without acute intracranial process.  CT cervical spine without contrast is negative for acute cervical spine fracture.  Multilevel cervical spondylosis and left lobe thyroid nodule noted.  Scalp laceration was repaired by EDP with 2 staples placed.  Patient was given 1 L normal saline and 1 mg IV Ativan.  The hospitalist service was consulted to admit for further evaluation and management.  Review of Systems:  All systems reviewed and are negative except as documented in history of present illness above.   Past Medical History:  Diagnosis Date  . Alcohol use     Past Surgical History:  Procedure Laterality Date  . VIDEO ASSISTED THORACOSCOPY (VATS)/EMPYEMA Left 05/27/2018   Procedure: VIDEO ASSISTED THORACOSCOPY (VATS)/EMPYEMA;  Surgeon: Ivin Poot, MD;  Location: [place];  Service: Thoracic;  Laterality: Left;    Social History:  reports that he has been smoking. He has never used smokeless tobacco. He reports current alcohol use. No history on file for drug use.  No Known Allergies  Family History  Problem Relation Age of Onset  . Prostate cancer Father      Prior to Admission medications   Medication Sig Start Date End Date Taking? Authorizing Provider  acetaminophen (TYLENOL) 500 MG tablet Take 2 tablets (1,000 mg total) by mouth every 6 (six) hours as needed for mild pain or fever. Patient not taking: No sig reported 05/31/18   Barrett, Erin R, PA-C  lisinopril (PRINIVIL,ZESTRIL) 5 MG tablet Take 1 tablet (5 mg total) by mouth daily. Patient not taking: Reported on 02/25/2021 05/31/18   Marcene Corning    Physical Exam: Vitals:   02/25/21 1700 02/25/21 1730 02/25/21 1800 02/25/21 1930  BP: 124/77 134/90 118/86 (!) 135/98  Pulse: 89 82 86 80  Resp: _0 Temp:      TempSrc:      SpO2: 99% 98% 98% 100%   Constitutional: Somnolent but will awaken to answer questions, or NAD, calm Eyes: PERRL, lids and conjunctivae normal ENMT: Mucous membranes are dry. Posterior pharynx clear of any exudate or lesions.Normal dentition.  Neck: normal, supple, no masses. Respiratory: clear to auscultation bilaterally, no wheezing, no crackles. Normal respiratory effort. No accessory muscle use.  Cardiovascular: Regular rate and rhythm, no murmurs / rubs / gallops. No extremity edema. 2+ pedal pulses. Abdomen: no tenderness, no masses palpated. Bowel sounds positive.  Musculoskeletal: no clubbing / cyanosis. No joint deformity upper and lower extremities. Good ROM, no contractures. Normal muscle tone.  Skin: Approximately 1 cm linear posterior superior scalp laceration s/p repair with 2 staples.  Abrasion over the right lateral eyebrow. Neurologic: CN 2-12 grossly intact.  Speech is somewhat delayed/slow.  Sensation intact. Strength 5/5 in all 4.  Psychiatric: Somnolent, briefly awakens to answer questions.  Oriented to self and place but not year or situation.  Labs on Admission: I have personally reviewed following labs and imaging studies  CBC: Recent Labs  Lab 02/25/21 1625  WBC 16.6*  NEUTROABS 12.5*  HGB 15.1  HCT 47.7  MCV 99.4  PLT 016   Basic Metabolic Panel: Recent Labs  Lab 02/25/21 1625  NA 137  K 4.6  CL 104  CO2 19*  GLUCOSE 175*  BUN 5*  CREATININE 1.47*  CALCIUM 8.6*   GFR: CrCl cannot be calculated (Unknown ideal weight.). Liver Function Tests: Recent Labs  Lab 02/25/21 1625  AST 47*  ALT 31  ALKPHOS 42  BILITOT 1.2  PROT 6.3*  ALBUMIN 3.9   No results for input(s): LIPASE, AMYLASE in the last 168 hours. Recent Labs  Lab 02/25/21 1625  AMMONIA 85*   Coagulation Profile: No results for input(s): INR, PROTIME in the last 168 hours. Cardiac Enzymes: No results for input(s): CKTOTAL, CKMB, CKMBINDEX,  TROPONINI in the last 168 hours. BNP (last 3 results) No results for input(s): PROBNP in the last 8760 hours. HbA1C: No results for input(s): HGBA1C in the last 72 hours. CBG: Recent Labs  Lab 02/25/21 1647  GLUCAP 130*   Lipid Profile: No results for input(s): CHOL, HDL, LDLCALC, TRIG, CHOLHDL, LDLDIRECT in the last 72 hours. Thyroid Function Tests: No results for input(s): TSH, T4TOTAL, FREET4, T3FREE, THYROIDAB in the last 72 hours. Anemia Panel: No results for input(s): VITAMINB12, FOLATE, FERRITIN, TIBC, IRON, RETICCTPCT in the last 72 hours. Urine analysis:    Component Value Date/Time   COLORURINE AMBER (A) 05/26/2018 0520   APPEARANCEUR HAZY (A) 05/26/2018 0520   LABSPEC 1.038 (H) 05/26/2018 0520   PHURINE 5.0 05/26/2018 0520   GLUCOSEU NEGATIVE 05/26/2018 0520   HGBUR NEGATIVE 05/26/2018 0520   BILIRUBINUR NEGATIVE 05/26/2018 0520   KETONESUR NEGATIVE 05/26/2018 0520   PROTEINUR NEGATIVE 05/26/2018 0520   NITRITE  NEGATIVE 05/26/2018 0520   LEUKOCYTESUR NEGATIVE 05/26/2018 0520    Radiological Exams on Admission: CT HEAD WO CONTRAST  Result Date: 02/25/2021 CLINICAL DATA:  Syncope, fell backwards, struck back of head EXAM: CT HEAD WITHOUT CONTRAST TECHNIQUE: Contiguous axial images were obtained from the base of the skull through the vertex without intravenous contrast. COMPARISON:  None. FINDINGS: Brain: No acute infarct or hemorrhage. Lateral ventricles and midline structures are unremarkable. No acute extra-axial fluid collections. No mass effect. Prominent cerebellar atrophy. Vascular: No hyperdense vessel or unexpected calcification. Skull: Soft tissue swelling overlying the occiput. No underlying fracture. The remainder of the calvarium is unremarkable. Sinuses/Orbits: Minimal polypoid mucosal thickening anterior left ethmoid air cell. Remaining sinuses are clear. Other: Reconstructed images demonstrate no additional findings. IMPRESSION: 1. Occipital scalp hematoma.  2. No acute intracranial process. Electronically Signed   By: Randa Ngo M.D.   On: 02/25/2021 18:50   CT CERVICAL SPINE WO CONTRAST  Result Date: 02/25/2021 CLINICAL DATA:  [place], hit back of head EXAM: CT CERVICAL SPINE WITHOUT CONTRAST TECHNIQUE: Multidetector CT imaging of the cervical spine was performed without intravenous contrast. Multiplanar CT image reconstructions were also generated. COMPARISON:  None. FINDINGS: Alignment: Alignment is anatomic. Skull base and vertebrae: No acute fracture. No primary bone lesion or focal pathologic process. Soft tissues and spinal canal: No prevertebral fluid or swelling. No visible canal hematoma. Left lobe thyroid nodule measuring up to 3.3 cm has been previously evaluated by fine-needle aspiration. Disc levels: There is multilevel cervical spondylosis from C3-4 through C6-7, with mild symmetrical neural foraminal encroachment. Upper chest: Airway is patent.  Lung apices are clear. Other: Reconstructed images demonstrate no additional findings. IMPRESSION: 1. No acute cervical spine fracture. 2. Multilevel cervical spondylosis. 3. Left lobe thyroid nodule. This has been evaluated on previous imaging. (ref: J Am Coll Radiol. 2015 Feb;12(2): 143-50). Electronically Signed   By: Randa Ngo M.D.   On: 02/25/2021 18:52   DG Chest Port 1 View  Result Date: 02/25/2021 CLINICAL DATA:  Syncopal episode today. Hit head. No current chest complaints. EXAM: PORTABLE CHEST 1 VIEW COMPARISON:  08/31/2018. FINDINGS: Cardiac silhouette is normal in size. No mediastinal or hilar masses. Prominent bronchovascular markings noted in lower lungs accentuated by low lung volumes. Lungs otherwise clear. No convincing pleural effusion and no pneumothorax. Skeletal structures are grossly intact. IMPRESSION: No acute cardiopulmonary disease. Electronically Signed   By: Lajean Manes M.D.   On: 02/25/2021 16:56    EKG: Personally reviewed. Sinus rhythm, LAE, LVH, nonspecific T wave  changes throughout.  T wave changes new compared to prior.  Assessment/Plan Principal Problem:   Syncope and collapse Active Problems:   AKI (acute kidney injury) ([place])   Occipital scalp laceration   John Rodriguez is a 55 y.o. male with medical history significant for tobacco and alcohol use who is admitted after syncopal episode.  Syncope and collapse: Suspect vasovagal/orthostatic in the setting of alcohol use while outside in the heat.  Initial troponin negative.  EKG shows chronic LVH with nonspecific T wave changes. -Check orthostatic vitals -Continue IV fluid hydration overnight -Monitor on telemetry  Acute kidney injury: Likely related to unresponsive episode.  Check urinalysis and continue IV fluids overnight.  Altered mental status: Suspect he has a concussion after head injury.  AMS also compounded by alcohol intoxication.  CT head is negative for intracranial abnormality.  Posterior occipital laceration with hematoma: S/p repair by EDP with placement of 2 staples which will need to be removed  in 7-10 days.  Leukocytosis: Likely reactive.  No obvious infection.  Monitor.  Alcohol use: Monitor CIWA without scheduled or prn symptoms at this time.  DVT prophylaxis: SCDs only for now given head trauma Code Status: Full code Family Communication: Discussed with patient Disposition Plan: From home and likely discharge to home pending clinical progress Consults called: None Level of care: Telemetry Medical Admission status:  Status is: Observation  The patient remains OBS appropriate and will d/c before 2 midnights.  Dispo: The patient is from: Home              Anticipated d/c is to: Home              Patient currently is not medically stable to d/c.   Difficult to place patient No   Zada Finders MD [facility]  If 7PM-7AM, please contact night-coverage [email or web address]  02/25/2021, 7:56 PM

## 2021-02-25 NOTE — ED Notes (Signed)
ED Provider at bedside. 

## 2021-02-25 NOTE — ED Triage Notes (Signed)
Witnessed syncope, outside of apartment complex, was seen drinking a beer, syncopal episode fell backwards hit back of head. Agonal respirations when fire arrived and they gave 2 mg narcan IN. Medic gave 1,mg narcan IV. Pupils constricted, bagged by medic, ETOH. Bp117/77, hr 96, cbg 244

## 2021-02-25 NOTE — ED Notes (Signed)
Patient transported to CT 

## 2021-02-25 NOTE — ED Notes (Signed)
Md Allena Katz notified of pts increased troponin 45

## 2021-02-25 NOTE — ED Notes (Signed)
Wounds and abrasions on anterior aspect of face rinsed with ~51mL NS and bacitracin applied. Wound on posterior head rinsed as well.

## 2021-02-25 NOTE — ED Provider Notes (Addendum)
A [facility] EMERGENCY DEPARTMENT Provider Note   CSN: [id number] Arrival date & time: 02/25/21  1559     History Chief Complaint  Patient presents with  . Loss of Consciousness    John Rodriguez is a 55 y.o. male.  HPI   Patient presents to the ED for an episode of syncope and loss of consciousness.  According to the EMS report bystanders saw him sitting outside drinking a beer.  He went to stand up and he suddenly fell backwards and struck his head.  Patient was unresponsive.  EMS was called.  There was no report of seizure activity.  They had to assist him with his respirations for approximately 10 minutes.  His GCS was 3.  Patient was given rounds of Narcan without any apparent immediate response.  On the way to the ED patient started to become more alert.  He is not communicating clearly but is now speaking.  Patient's unable to tell me what happened.  He can tell me his name.  He does not answer me when I asked him if he was feeling poorly or if anything is hurting him.  Past Medical History:  Diagnosis Date  . Alcohol use     Patient Active Problem List   Diagnosis Date Noted  . Syncope and collapse 02/25/2021  . AKI (acute kidney injury) (HCC) 02/25/2021  . Occipital scalp laceration 02/25/2021  . Empyema of pleural space (HCC) 05/27/2018  . S/P thoracentesis   . Tobacco abuse 05/22/2018  . Pleural effusion on left 05/21/2018    Past Surgical History:  Procedure Laterality Date  . VIDEO ASSISTED THORACOSCOPY (VATS)/EMPYEMA Left 05/27/2018   Procedure: VIDEO ASSISTED THORACOSCOPY (VATS)/EMPYEMA;  Surgeon: Kerin Perna, MD;  Location: [facility] OR;  Service: Thoracic;  Laterality: Left;       Family History  Problem Relation Age of Onset  . Prostate cancer Father     Social History   Tobacco Use  . Smoking status: Current Every Day Smoker  . Smokeless tobacco: Never Used  Substance Use Topics  . Alcohol use: Yes    Comment: Occasionally.     Home Medications Prior to Admission medications   Medication Sig Start Date End Date Taking? Authorizing Provider  acetaminophen (TYLENOL) 500 MG tablet Take 2 tablets (1,000 mg total) by mouth every 6 (six) hours as needed for mild pain or fever. Patient not taking: No sig reported 05/31/18   Barrett, Erin R, PA-C  lisinopril (PRINIVIL,ZESTRIL) 5 MG tablet Take 1 tablet (5 mg total) by mouth daily. Patient not taking: Reported on 02/25/2021 05/31/18   Barrett, Rae Roam, PA-C    Allergies    Patient has no known allergies.  Review of Systems   Review of Systems  All other systems reviewed and are negative.   Physical Exam Updated Vital Signs BP (!) 135/98   Pulse 80   Temp 97.6 F (36.4 C) (Oral)   Resp 15   SpO2 100%   Physical Exam Vitals and nursing note reviewed.  Constitutional:      Appearance: He is well-developed. He is ill-appearing. He is not diaphoretic.  HENT:     Head: Normocephalic.     Comments: Laceration posterior occiput, abrasion noted to the left parietal region of the skull    Right Ear: External ear normal.     Left Ear: External ear normal.  Eyes:     General: No scleral icterus.       Right eye:  No discharge.        Left eye: No discharge.     Conjunctiva/sclera: Conjunctivae normal.  Neck:     Trachea: No tracheal deviation.  Cardiovascular:     Rate and Rhythm: Normal rate and regular rhythm.  Pulmonary:     Effort: Pulmonary effort is normal. No respiratory distress.     Breath sounds: Normal breath sounds. No stridor. No wheezing or rales.  Abdominal:     General: Bowel sounds are normal. There is no distension.     Palpations: Abdomen is soft.     Tenderness: There is no abdominal tenderness. There is no guarding or rebound.  Musculoskeletal:        General: No tenderness.     Cervical back: Neck supple.  Skin:    General: Skin is warm and dry.     Findings: No rash.  Neurological:     Cranial Nerves: No cranial nerve deficit (no  facial droop, extraocular movements intact, slurred speech ).     Sensory: No sensory deficit.     Motor: No abnormal muscle tone or seizure activity.     Coordination: Coordination normal.     Comments: Patient does answer with his name, he does move his hands and feet equally, he does intermittently follow commands and will squeeze my fingers when asked to do so.  He continues to try to sit up in bed     ED Results / Procedures / Treatments   Labs (all labs ordered are listed, but only abnormal results are displayed) Labs Reviewed  COMPREHENSIVE METABOLIC PANEL - Abnormal; Notable for the following components:      Result Value   CO2 19 (*)    Glucose, Bld 175 (*)    BUN 5 (*)    Creatinine, Ser 1.47 (*)    Calcium 8.6 (*)    Total Protein 6.3 (*)    AST 47 (*)    GFR, Estimated 56 (*)    All other components within normal limits  CBC WITH DIFFERENTIAL/PLATELET - Abnormal; Notable for the following components:   WBC 16.6 (*)    Neutro Abs 12.5 (*)    Abs Immature Granulocytes 0.55 (*)    All other components within normal limits  AMMONIA - Abnormal; Notable for the following components:   Ammonia 85 (*)    All other components within normal limits  ETHANOL - Abnormal; Notable for the following components:   Alcohol, Ethyl (B) 55 (*)    All other components within normal limits  CBG MONITORING, ED - Abnormal; Notable for the following components:   Glucose-Capillary 130 (*)    All other components within normal limits  URINALYSIS, COMPLETE (UACMP) WITH MICROSCOPIC  RAPID URINE DRUG SCREEN, HOSP PERFORMED  PROTIME-INR  TROPONIN I (HIGH SENSITIVITY)  TROPONIN I (HIGH SENSITIVITY)    EKG None  Radiology CT HEAD WO CONTRAST  Result Date: 02/25/2021 CLINICAL DATA:  Syncope, fell backwards, struck back of head EXAM: CT HEAD WITHOUT CONTRAST TECHNIQUE: Contiguous axial images were obtained from the base of the skull through the vertex without intravenous contrast.  COMPARISON:  None. FINDINGS: Brain: No acute infarct or hemorrhage. Lateral ventricles and midline structures are unremarkable. No acute extra-axial fluid collections. No mass effect. Prominent cerebellar atrophy. Vascular: No hyperdense vessel or unexpected calcification. Skull: Soft tissue swelling overlying the occiput. No underlying fracture. The remainder of the calvarium is unremarkable. Sinuses/Orbits: Minimal polypoid mucosal thickening anterior left ethmoid air cell. Remaining sinuses are clear.  Other: Reconstructed images demonstrate no additional findings. IMPRESSION: 1. Occipital scalp hematoma. 2. No acute intracranial process. Electronically Signed   By: Sharlet Salina M.D.   On: 02/25/2021 18:50   CT CERVICAL SPINE WO CONTRAST  Result Date: 02/25/2021 CLINICAL DATA:  Larey Seat, hit back of head EXAM: CT CERVICAL SPINE WITHOUT CONTRAST TECHNIQUE: Multidetector CT imaging of the cervical spine was performed without intravenous contrast. Multiplanar CT image reconstructions were also generated. COMPARISON:  None. FINDINGS: Alignment: Alignment is anatomic. Skull base and vertebrae: No acute fracture. No primary bone lesion or focal pathologic process. Soft tissues and spinal canal: No prevertebral fluid or swelling. No visible canal hematoma. Left lobe thyroid nodule measuring up to 3.3 cm has been previously evaluated by fine-needle aspiration. Disc levels: There is multilevel cervical spondylosis from C3-4 through C6-7, with mild symmetrical neural foraminal encroachment. Upper chest: Airway is patent.  Lung apices are clear. Other: Reconstructed images demonstrate no additional findings. IMPRESSION: 1. No acute cervical spine fracture. 2. Multilevel cervical spondylosis. 3. Left lobe thyroid nodule. This has been evaluated on previous imaging. (ref: J Am Coll Radiol. 2015 Feb;12(2): 143-50). Electronically Signed   By: Sharlet Salina M.D.   On: 02/25/2021 18:52   DG Chest Port 1 View  Result Date:  02/25/2021 CLINICAL DATA:  Syncopal episode today. Hit head. No current chest complaints. EXAM: PORTABLE CHEST 1 VIEW COMPARISON:  08/31/2018. FINDINGS: Cardiac silhouette is normal in size. No mediastinal or hilar masses. Prominent bronchovascular markings noted in lower lungs accentuated by low lung volumes. Lungs otherwise clear. No convincing pleural effusion and no pneumothorax. Skeletal structures are grossly intact. IMPRESSION: No acute cardiopulmonary disease. Electronically Signed   By: Amie Portland M.D.   On: 02/25/2021 16:56    Procedures .Marland KitchenLaceration Repair  Date/Time: 02/25/2021 7:55 PM Performed by: Linwood Dibbles, MD Authorized by: Linwood Dibbles, MD   Consent:    Consent obtained:  Verbal   Consent given by:  Patient   Risks discussed:  Infection, need for additional repair, pain, poor cosmetic result and poor wound healing   Alternatives discussed:  No treatment and delayed treatment Universal protocol:    Procedure explained and questions answered to patient or proxy's satisfaction: yes     Relevant documents present and verified: yes     Test results available: yes     Imaging studies available: yes     Required blood products, implants, devices, and special equipment available: yes     Site/side marked: yes     Immediately prior to procedure, a time out was called: yes     Patient identity confirmed:  Verbally with patient Anesthesia:    Anesthesia method:  None Laceration details:    Location:  Scalp   Length (cm):  1 Exploration:    Wound extent: no underlying fracture noted and no vascular damage noted     Contaminated: no   Treatment:    Area cleansed with:  Chlorhexidine   Amount of cleaning:  Standard   Irrigation solution:  Sterile saline   Debridement:  None   Undermining:  None Skin repair:    Repair method:  Staples   Number of staples:  2 Approximation:    Approximation:  Close Repair type:    Repair type:  Simple Post-procedure details:    Dressing:   Open (no dressing)   Procedure completion:  Tolerated well, no immediate complications     Medications Ordered in ED Medications  LORazepam (ATIVAN) injection 1 mg (1 mg  Intravenous Given 02/25/21 1620)  sodium chloride 0.9 % bolus 1,000 mL (0 mLs Intravenous Stopped 02/25/21 1800)    ED Course  I have reviewed the triage vital signs and the nursing notes.  Pertinent labs & imaging results that were available during my care of the patient were reviewed by me and considered in my medical decision making (see chart for details).  Clinical Course as of 02/25/21 1957  Tue Feb 25, 2021  1800 Ethanol slightly elevated at 55.  Ammonia level elevated at 85 [JK]  1914 CT C-spine without acute finding CT head without acute finding [JK]  1915 Chest x-ray negative [JK]  1926 Labs notable for decreased bicarb. [JK]    Clinical Course User Index [JK] Linwood Dibbles, MD   MDM Rules/Calculators/A&P                          Patient presented to the ED for evaluation after a syncopal episode.  EMS had to provide bag-valve-mask resuscitation.  He was given doses of Narcan with eventual improvement although he did not seem to immediately improve after the Narcan.  Patient's ED work-up does not show any signs of cerebral hemorrhage.  No signs of C-spine fracture.  No pneumonia.  Patient does have an elevated white blood cell count but he is afebrile and no signs of infection.  Does have slightly decreased bicarbonate level and elevated ammonia level.  The rest of his LFTs however are unremarkable.  Not clearly hepatic encephalopathy.  Alcohol level is slightly elevated.  Patient's mental status has improved.  He is answering questions appropriately as although this appears somewhat fatigued still.  No seizure reported although it is somewhat of a concern still.  I do think he would benefit from overnight monitoring considering the severity of his initial symptoms.  I will consult the medical service Final Clinical  Impression(s) / ED Diagnoses Final diagnoses:  Syncope, unspecified syncope type  Increased ammonia level  Laceration of scalp, initial encounter        Linwood Dibbles, MD 02/25/21 [phone]

## 2021-02-26 ENCOUNTER — Encounter (HOSPITAL_COMMUNITY): Payer: Self-pay | Admitting: Internal Medicine

## 2021-02-26 ENCOUNTER — Observation Stay (HOSPITAL_BASED_OUTPATIENT_CLINIC_OR_DEPARTMENT_OTHER): Payer: Self-pay

## 2021-02-26 DIAGNOSIS — R55 Syncope and collapse: Secondary | ICD-10-CM

## 2021-02-26 DIAGNOSIS — N179 Acute kidney failure, unspecified: Secondary | ICD-10-CM

## 2021-02-26 LAB — COMPREHENSIVE METABOLIC PANEL
ALT: 20 U/L (ref 0–44)
AST: 26 U/L (ref 15–41)
Albumin: 3.6 g/dL (ref 3.5–5.0)
Alkaline Phosphatase: 33 U/L — ABNORMAL LOW (ref 38–126)
Anion gap: 6 (ref 5–15)
BUN: 7 mg/dL (ref 6–20)
CO2: 27 mmol/L (ref 22–32)
Calcium: 8.8 mg/dL — ABNORMAL LOW (ref 8.9–10.3)
Chloride: 105 mmol/L (ref 98–111)
Creatinine, Ser: 0.98 mg/dL (ref 0.61–1.24)
GFR, Estimated: >60 mL/min (ref >60)
Glucose, Bld: 120 mg/dL — ABNORMAL HIGH (ref 70–99)
Potassium: 4.1 mmol/L (ref 3.5–5.1)
Sodium: 138 mmol/L (ref 135–145)
Total Bilirubin: 0.7 mg/dL (ref 0.3–1.2)
Total Protein: 6.1 g/dL — ABNORMAL LOW (ref 6.5–8.1)

## 2021-02-26 LAB — HIV ANTIBODY (ROUTINE TESTING W REFLEX): HIV Screen 4th Generation wRfx: NONREACTIVE

## 2021-02-26 LAB — CBC
HCT: 41.8 % (ref 39.0–52.0)
Hemoglobin: 13.5 g/dL (ref 13.0–17.0)
MCH: 31.1 pg (ref 26.0–34.0)
MCHC: 32.3 g/dL (ref 30.0–36.0)
MCV: 96.3 fL (ref 80.0–100.0)
Platelets: 189 10*3/uL (ref 150–400)
RBC: 4.34 MIL/uL (ref 4.22–5.81)
RDW: 13.3 % (ref 11.5–15.5)
WBC: 8.2 10*3/uL (ref 4.0–10.5)
nRBC: 0 % (ref 0.0–0.2)

## 2021-02-26 LAB — ECHOCARDIOGRAM COMPLETE
AR max vel: 2.58 cm2
AV Area VTI: 2.5 cm2
AV Area mean vel: 2.38 cm2
AV Mean grad: 5 mmHg
AV Peak grad: 9.7 mmHg
Ao pk vel: 1.56 m/s
Area-P 1/2: 3.72 cm2
S' Lateral: 3.4 cm
Weight: 3305.14 oz

## 2021-02-26 LAB — SARS CORONAVIRUS 2 (TAT 6-24 HRS): SARS Coronavirus 2: NEGATIVE

## 2021-02-26 MED ORDER — NICOTINE 21 MG/24HR TD PT24
21.0000 mg | MEDICATED_PATCH | Freq: Every day | TRANSDERMAL | Status: DC
  Administered 2021-02-26: 21 mg via TRANSDERMAL
  Filled 2021-02-26 (×2): qty 1

## 2021-02-26 NOTE — Plan of Care (Signed)

## 2021-02-26 NOTE — Progress Notes (Signed)
Bedside shift report completed. Received patient awake,alert and oriented to person and place but disoriented to situation. Patient reoriented at this time. NAD noted; respirations easy/ even on room air. Staples to back of head noted; lacerations to head noted. Movement/sensation noted to all extremities. Telesitter at bedside at this time. Whiteboard updated. All safety measures in place and personal belongings within reach.

## 2021-02-26 NOTE — Progress Notes (Incomplete)
  Echocardiogram 2D Echocardiogram has been performed.  Orazio Weller  Marthann Schiller 02/26/2021, 2:17 PM

## 2021-02-26 NOTE — Plan of Care (Signed)
  Problem: Education: Goal: Knowledge of General Education information will improve Description Including pain rating scale, medication(s)/side effects and non-pharmacologic comfort measures Outcome: Progressing   

## 2021-02-26 NOTE — Progress Notes (Signed)
[facility] PROGRESS NOTE    Progress Note  John Rodriguez  PNT:[id number] DOB: Sep 21, 1966 DOA: 02/25/2021 PCP: Lavinia Sharps, NP     Brief Narrative:   John Rodriguez is an 55 y.o. male past medical history significant for tobacco and alcohol abuse comes into the ED for syncopal episode.  He went to stand up and suddenly fell backwards and hit his head EMS was called, he was given Narcan with minimal response.  UDS was positive for cannabis.  Assessment/Plan:   Syncope and collapse Orthostatics are still pending, continue IV fluids. EKG showed nonspecific T wave changes.,  Cardiac biomarkers have basically remained flat he denies any chest pain. CT of the head showed no acute findings intracranially, he does have an occipital skull scalp hematoma. Echo pending. Question due to alcohol and cannabis use.  AKI (acute kidney injury) (HCC) On admission 1.4 he was started on IV fluids now resolved.  Acute metabolic ncephalopathy: In the setting of alcohol inebriation and cannabis use. CT scan of the head showed no acute intracranial abnormalities.  Posterior occipital laceration with hematoma: Status post repair by ED need to be removed in 7 to 10 days.  Leukocytosis: Likely reactive no signs of infection.  Alcohol abuse: Started on thiamine and folate continue to monitor with CIWA protocol.   Occipital scalp laceration    DVT prophylaxis: lovenox Family Communication:none Status is: Observation  The patient remains OBS appropriate and will d/c before 2 midnights.  Dispo: The patient is from: Home              Anticipated d/c is to: Home              Patient currently is not medically stable to d/c.   Difficult to place patient No        Code Status:     Code Status Orders  (From admission, onward)         Start     Ordered   02/25/21 2019  Full code  Continuous        02/25/21 2020        Code Status History    Date Active Date Inactive  Code Status Order ID Comments User Context   05/22/2018 0107 05/31/2018 1449 Full Code [id number]  Eduard Clos, MD Inpatient   Advance Care Planning Activity        IV Access:    Peripheral IV   Procedures and diagnostic studies:   CT HEAD WO CONTRAST  Result Date: 02/25/2021 CLINICAL DATA:  Syncope, fell backwards, struck back of head EXAM: CT HEAD WITHOUT CONTRAST TECHNIQUE: Contiguous axial images were obtained from the base of the skull through the vertex without intravenous contrast. COMPARISON:  None. FINDINGS: Brain: No acute infarct or hemorrhage. Lateral ventricles and midline structures are unremarkable. No acute extra-axial fluid collections. No mass effect. Prominent cerebellar atrophy. Vascular: No hyperdense vessel or unexpected calcification. Skull: Soft tissue swelling overlying the occiput. No underlying fracture. The remainder of the calvarium is unremarkable. Sinuses/Orbits: Minimal polypoid mucosal thickening anterior left ethmoid air cell. Remaining sinuses are clear. Other: Reconstructed images demonstrate no additional findings. IMPRESSION: 1. Occipital scalp hematoma. 2. No acute intracranial process. Electronically Signed   By: Sharlet Salina M.D.   On: 02/25/2021 18:50   CT CERVICAL SPINE WO CONTRAST  Result Date: 02/25/2021 CLINICAL DATA:  Larey Seat, hit back of head EXAM: CT CERVICAL SPINE WITHOUT CONTRAST TECHNIQUE: Multidetector CT imaging of the cervical spine was performed  without intravenous contrast. Multiplanar CT image reconstructions were also generated. COMPARISON:  None. FINDINGS: Alignment: Alignment is anatomic. Skull base and vertebrae: No acute fracture. No primary bone lesion or focal pathologic process. Soft tissues and spinal canal: No prevertebral fluid or swelling. No visible canal hematoma. Left lobe thyroid nodule measuring up to 3.3 cm has been previously evaluated by fine-needle aspiration. Disc levels: There is multilevel cervical spondylosis  from C3-4 through C6-7, with mild symmetrical neural foraminal encroachment. Upper chest: Airway is patent.  Lung apices are clear. Other: Reconstructed images demonstrate no additional findings. IMPRESSION: 1. No acute cervical spine fracture. 2. Multilevel cervical spondylosis. 3. Left lobe thyroid nodule. This has been evaluated on previous imaging. (ref: J Am Coll Radiol. 2015 Feb;12(2): 143-50). Electronically Signed   By: Sharlet Salina M.D.   On: 02/25/2021 18:52   DG Chest Port 1 View  Result Date: 02/25/2021 CLINICAL DATA:  Syncopal episode today. Hit head. No current chest complaints. EXAM: PORTABLE CHEST 1 VIEW COMPARISON:  08/31/2018. FINDINGS: Cardiac silhouette is normal in size. No mediastinal or hilar masses. Prominent bronchovascular markings noted in lower lungs accentuated by low lung volumes. Lungs otherwise clear. No convincing pleural effusion and no pneumothorax. Skeletal structures are grossly intact. IMPRESSION: No acute cardiopulmonary disease. Electronically Signed   By: Amie Portland M.D.   On: 02/25/2021 16:56     Medical Consultants:    None.   Subjective:    John Rodriguez no complaints has no recollection of the episode.  He does relate he had no prodromal symptoms.  Objective:    Vitals:   02/25/21 2230 02/26/21 0121 02/26/21 0606 02/26/21 0850  BP: (!) 129/92 139/90 (!) 104/94 (!) 137/98  Pulse: 73 71 61 (!) 59  Resp:  17 17 14   Temp:  98.3 F (36.8 C) 98 F (36.7 C) 98 F (36.7 C)  TempSrc:  Oral Oral Oral  SpO2: 100% 97% 100% 96%  Weight:   93.7 kg    SpO2: 96 % O2 Flow Rate (L/min): 2 L/min   Intake/Output Summary (Last 24 hours) at 02/26/2021 0928 Last data filed at 02/26/2021 0600 Gross per 24 hour  Intake 1964.54 ml  Output 500 ml  Net 1464.54 ml   Filed Weights   02/26/21 0606  Weight: 93.7 kg    Exam: General exam: In no acute distress. Respiratory system: Good air movement and clear to auscultation. Cardiovascular system:  S1 & S2 heard, RRR. No JVD. Gastrointestinal system: Abdomen is nondistended, soft and nontender.  Extremities: No pedal edema. Skin: Multiple traumas to his head and scratches a large scalp hematoma with 2 staples in place. Psychiatry: Judgement and insight appear normal. Mood & affect appropriate.    Data Reviewed:    Labs: Basic Metabolic Panel: Recent Labs  Lab 02/25/21 1625 02/26/21 0443  NA 137 138  K 4.6 4.1  CL 104 105  CO2 19* 27  GLUCOSE 175* 120*  BUN 5* 7  CREATININE 1.47* 0.98  CALCIUM 8.6* 8.8*   GFR CrCl cannot be calculated (Unknown ideal weight.). Liver Function Tests: Recent Labs  Lab 02/25/21 1625 02/26/21 0443  AST 47* 26  ALT 31 20  ALKPHOS 42 33*  BILITOT 1.2 0.7  PROT 6.3* 6.1*  ALBUMIN 3.9 3.6   No results for input(s): LIPASE, AMYLASE in the last 168 hours. Recent Labs  Lab 02/25/21 1625  AMMONIA 85*   Coagulation profile Recent Labs  Lab 02/25/21 2000  INR 1.0   COVID-19 Labs  No results for input(s): DDIMER, FERRITIN, LDH, CRP in the last 72 hours.  Lab Results  Component Value Date   SARSCOV2NAA NEGATIVE 02/25/2021    CBC: Recent Labs  Lab 02/25/21 1625 02/26/21 0443  WBC 16.6* 8.2  NEUTROABS 12.5*  --   HGB 15.1 13.5  HCT 47.7 41.8  MCV 99.4 96.3  PLT 274 189   Cardiac Enzymes: No results for input(s): CKTOTAL, CKMB, CKMBINDEX, TROPONINI in the last 168 hours. BNP (last 3 results) No results for input(s): PROBNP in the last 8760 hours. CBG: Recent Labs  Lab 02/25/21 1647  GLUCAP 130*   D-Dimer: No results for input(s): DDIMER in the last 72 hours. Hgb A1c: No results for input(s): HGBA1C in the last 72 hours. Lipid Profile: No results for input(s): CHOL, HDL, LDLCALC, TRIG, CHOLHDL, LDLDIRECT in the last 72 hours. Thyroid function studies: No results for input(s): TSH, T4TOTAL, T3FREE, THYROIDAB in the last 72 hours.  Invalid input(s): FREET3 Anemia work up: No results for input(s): VITAMINB12,  FOLATE, FERRITIN, TIBC, IRON, RETICCTPCT in the last 72 hours. Sepsis Labs: Recent Labs  Lab 02/25/21 1625 02/26/21 0443  WBC 16.6* 8.2   Microbiology Recent Results (from the past 240 hour(s))  SARS CORONAVIRUS 2 (TAT 6-24 HRS) Nasopharyngeal Nasopharyngeal Swab     Status: None   Collection Time: 02/25/21  8:00 PM   Specimen: Nasopharyngeal Swab  Result Value Ref Range Status   SARS Coronavirus 2 NEGATIVE NEGATIVE Final    Comment: (NOTE) SARS-CoV-2 target nucleic acids are NOT DETECTED.  The SARS-CoV-2 RNA is generally detectable in upper and lower respiratory specimens during the acute phase of infection. Negative results do not preclude SARS-CoV-2 infection, do not rule out co-infections with other pathogens, and should not be used as the sole basis for treatment or other patient management decisions. Negative results must be combined with clinical observations, patient history, and epidemiological information. The expected result is Negative.  Fact Sheet for Patients: HairSlick.no  Fact Sheet for Healthcare Providers: quierodirigir.com  This test is not yet approved or cleared by the [place] FDA and  has been authorized for detection and/or diagnosis of SARS-CoV-2 by FDA under an Emergency Use Authorization (EUA). This EUA will remain  in effect (meaning this test can be used) for the duration of the COVID-19 declaration under Se ction 564(b)(1) of the Act, 21 U.S.C. section 360bbb-3(b)(1), unless the authorization is terminated or revoked sooner.  Performed at [facility], [address], [place], [place] [address]      Medications:   . sodium chloride flush  3 mL Intravenous Q12H   Continuous Infusions:    LOS: 0 days   Marinda Elk  [facility]  02/26/2021, 9:28 AM

## 2021-02-27 DIAGNOSIS — S0101XA Laceration without foreign body of scalp, initial encounter: Principal | ICD-10-CM

## 2021-02-27 LAB — GLUCOSE, CAPILLARY: Glucose-Capillary: 90 mg/dL (ref 70–99)

## 2021-02-27 NOTE — Discharge Summary (Signed)
Physician Discharge Summary  John Rodriguez EXH:[id number] DOB: 12-Apr-1966 DOA: 02/25/2021  PCP: Lavinia Sharps, NP  Admit date: 02/25/2021 Discharge date: 02/27/2021  Admitted From: Home Disposition:  Home  Recommendations for Outpatient Follow-up:  1. Follow up with PCP in 1-2 weeks 2. Please obtain BMP/CBC in one week   Home Health:no Equipment/Devices:None  Discharge Condition:Stable CODE STATUS:Full Diet recommendation: Heart Healthy   Brief/Interim Summary: 55 y.o. male past medical history significant for tobacco and alcohol abuse comes into the ED for syncopal episode.  He went to stand up and suddenly fell backwards and hit his head EMS was called, he was given Narcan with minimal response.  UDS was positive for cannabis.  Discharge Diagnoses:  Principal Problem:   Syncope and collapse Active Problems:   AKI (acute kidney injury) (HCC)   Occipital scalp laceration  Syncope and collapse: Orthostatics were checked which were negative he had no events on telemetry, twelve-lead EKG showed nonspecific T wave changes. Cardiac biomarkers basically remained flat he denies any chest pain. CT scan of the head showed no acute intracranial finding it did showed a scalp occipital hematoma. 2D echo showed no aortic stenosis. Question etiology of his syncope was likely a mixture of alcohol and cannabis use.  Acute kidney injury: Admission 1.4 he was started on IV fluids and his creatinine returned to baseline.  Acute metabolic encephalopathy: In the setting of inebriation and cannabis use. Resolved after admission a CT scan of the head showed no acute intracranial abnormalities.  Posterior occipital laceration with hematoma: Status post repair by the ED stitches need to be removed by primary care doctor in 10 days.  Leukocytosis: Likely stress margination no signs of infection.  Alcohol abuse: He was started on thiamine and folate no signs of withdrawal he was monitored  with CIWA protocol.   Discharge Instructions  Discharge Instructions    Diet - low sodium heart healthy   Complete by: As directed    Discharge wound care:   Complete by: As directed    Needs to have his staples removed from 10 days postdischarge by primary care doctor or authorize provider.   Increase activity slowly   Complete by: As directed      Allergies as of 02/27/2021   No Known Allergies     Medication List    STOP taking these medications   acetaminophen 500 MG tablet Commonly known as: TYLENOL     TAKE these medications   lisinopril 5 MG tablet Commonly known as: ZESTRIL Take 1 tablet (5 mg total) by mouth daily.            Discharge Care Instructions  (From admission, onward)         Start     Ordered   02/27/21 0000  Discharge wound care:       Comments: Needs to have his staples removed from 10 days postdischarge by primary care doctor or authorize provider.   02/27/21 0829          No Known Allergies  Consultations:  None   Procedures/Studies: CT HEAD WO CONTRAST  Result Date: 02/25/2021 CLINICAL DATA:  Syncope, fell backwards, struck back of head EXAM: CT HEAD WITHOUT CONTRAST TECHNIQUE: Contiguous axial images were obtained from the base of the skull through the vertex without intravenous contrast. COMPARISON:  None. FINDINGS: Brain: No acute infarct or hemorrhage. Lateral ventricles and midline structures are unremarkable. No acute extra-axial fluid collections. No mass effect. Prominent cerebellar atrophy. Vascular: No hyperdense  vessel or unexpected calcification. Skull: Soft tissue swelling overlying the occiput. No underlying fracture. The remainder of the calvarium is unremarkable. Sinuses/Orbits: Minimal polypoid mucosal thickening anterior left ethmoid air cell. Remaining sinuses are clear. Other: Reconstructed images demonstrate no additional findings. IMPRESSION: 1. Occipital scalp hematoma. 2. No acute intracranial process.  Electronically Signed   By: Sharlet Salina M.D.   On: 02/25/2021 18:50   CT CERVICAL SPINE WO CONTRAST  Result Date: 02/25/2021 CLINICAL DATA:  Larey Seat, hit back of head EXAM: CT CERVICAL SPINE WITHOUT CONTRAST TECHNIQUE: Multidetector CT imaging of the cervical spine was performed without intravenous contrast. Multiplanar CT image reconstructions were also generated. COMPARISON:  None. FINDINGS: Alignment: Alignment is anatomic. Skull base and vertebrae: No acute fracture. No primary bone lesion or focal pathologic process. Soft tissues and spinal canal: No prevertebral fluid or swelling. No visible canal hematoma. Left lobe thyroid nodule measuring up to 3.3 cm has been previously evaluated by fine-needle aspiration. Disc levels: There is multilevel cervical spondylosis from C3-4 through C6-7, with mild symmetrical neural foraminal encroachment. Upper chest: Airway is patent.  Lung apices are clear. Other: Reconstructed images demonstrate no additional findings. IMPRESSION: 1. No acute cervical spine fracture. 2. Multilevel cervical spondylosis. 3. Left lobe thyroid nodule. This has been evaluated on previous imaging. (ref: J Am Coll Radiol. 2015 Feb;12(2): 143-50). Electronically Signed   By: Sharlet Salina M.D.   On: 02/25/2021 18:52   DG Chest Port 1 View  Result Date: 02/25/2021 CLINICAL DATA:  Syncopal episode today. Hit head. No current chest complaints. EXAM: PORTABLE CHEST 1 VIEW COMPARISON:  08/31/2018. FINDINGS: Cardiac silhouette is normal in size. No mediastinal or hilar masses. Prominent bronchovascular markings noted in lower lungs accentuated by low lung volumes. Lungs otherwise clear. No convincing pleural effusion and no pneumothorax. Skeletal structures are grossly intact. IMPRESSION: No acute cardiopulmonary disease. Electronically Signed   By: Amie Portland M.D.   On: 02/25/2021 16:56   ECHOCARDIOGRAM COMPLETE  Result Date: 02/26/2021    ECHOCARDIOGRAM REPORT   Patient Name:   John Rodriguez Date of Exam: 02/26/2021 Medical Rec #:  [id number]        Height:       67.0 in Accession #:    [id number]       Weight:       206.6 lb Date of Birth:  [birth year]        BSA:          2.050 m Patient Age:    55 years         BP:           137/101 mmHg Patient Gender: M                HR:           53 bpm. Exam Location:  Inpatient Procedure: 2D Echo, Cardiac Doppler and Color Doppler Indications:    Syncope  History:        Patient has prior history of Echocardiogram examinations, most                 recent 05/23/2018. Risk Factors:Current Smoker.  Sonographer:    Shirlean Kelly Referring Phys: [phone] Stephene Alegria FELIZ ORTIZ IMPRESSIONS  1. Left ventricular ejection fraction, by estimation, is 60 to 65%. The left ventricle has normal function. The left ventricle has no regional wall motion abnormalities. There is mild left ventricular hypertrophy. Left ventricular diastolic parameters were normal.  2. Right ventricular systolic function  is normal. The right ventricular size is normal.  3. The mitral valve is normal in structure. Trivial mitral valve regurgitation. No evidence of mitral stenosis.  4. The aortic valve is tricuspid. Aortic valve regurgitation is not visualized. No aortic stenosis is present.  5. The inferior vena cava is normal in size with greater than 50% respiratory variability, suggesting right atrial pressure of 3 mmHg. FINDINGS  Left Ventricle: Left ventricular ejection fraction, by estimation, is 60 to 65%. The left ventricle has normal function. The left ventricle has no regional wall motion abnormalities. The left ventricular internal cavity size was normal in size. There is  mild left ventricular hypertrophy. Left ventricular diastolic parameters were normal. Right Ventricle: The right ventricular size is normal. No increase in right ventricular wall thickness. Right ventricular systolic function is normal. Left Atrium: Left atrial size was normal in size. Right Atrium: Right atrial size  was normal in size. Pericardium: There is no evidence of pericardial effusion. Mitral Valve: The mitral valve is normal in structure. Trivial mitral valve regurgitation. No evidence of mitral valve stenosis. Tricuspid Valve: The tricuspid valve is normal in structure. Tricuspid valve regurgitation is trivial. No evidence of tricuspid stenosis. Aortic Valve: The aortic valve is tricuspid. Aortic valve regurgitation is not visualized. No aortic stenosis is present. Aortic valve mean gradient measures 5.0 mmHg. Aortic valve peak gradient measures 9.7 mmHg. Aortic valve area, by VTI measures 2.50 cm. Pulmonic Valve: The pulmonic valve was normal in structure. Pulmonic valve regurgitation is not visualized. No evidence of pulmonic stenosis. Aorta: The aortic root is normal in size and structure. Venous: The inferior vena cava is normal in size with greater than 50% respiratory variability, suggesting right atrial pressure of 3 mmHg. IAS/Shunts: No atrial level shunt detected by color flow Doppler.  LEFT VENTRICLE PLAX 2D LVIDd:         4.80 cm  Diastology LVIDs:         3.40 cm  LV e' medial:    10.60 cm/s LV PW:         1.10 cm  LV E/e' medial:  7.6 LV IVS:        1.20 cm  LV e' lateral:   15.30 cm/s LVOT diam:     2.00 cm  LV E/e' lateral: 5.3 LV SV:         74 LV SV Index:   36 LVOT Area:     3.14 cm  RIGHT VENTRICLE             IVC RV Basal diam:  3.00 cm     IVC diam: 0.80 cm RV S prime:     12.00 cm/s LEFT ATRIUM             Index       RIGHT ATRIUM           Index LA diam:        3.50 cm 1.71 cm/m  RA Area:     14.10 cm LA Vol (A2C):   57.8 ml 28.19 ml/m RA Volume:   30.60 ml  14.93 ml/m LA Vol (A4C):   49.9 ml 24.34 ml/m LA Biplane Vol: 54.6 ml 26.63 ml/m  AORTIC VALVE AV Area (Vmax):    2.58 cm AV Area (Vmean):   2.38 cm AV Area (VTI):     2.50 cm AV Vmax:           156.00 cm/s AV Vmean:  107.000 cm/s AV VTI:            0.296 m AV Peak Grad:      9.7 mmHg AV Mean Grad:      5.0 mmHg LVOT Vmax:          128.00 cm/s LVOT Vmean:        81.200 cm/s LVOT VTI:          0.236 m LVOT/AV VTI ratio: 0.80  AORTA Ao Root diam: 2.90 cm MITRAL VALVE               TRICUSPID VALVE MV Area (PHT): 3.72 cm    TR Peak grad:   27.7 mmHg MV Decel Time: 204 msec    TR Vmax:        263.00 cm/s MV E velocity: 80.40 cm/s MV A velocity: 81.50 cm/s  SHUNTS MV E/A ratio:  0.99        Systemic VTI:  0.24 m                            Systemic Diam: 2.00 cm Charlton HawsPeter Nishan MD Electronically signed by Charlton HawsPeter Nishan MD Signature Date/Time: 02/26/2021/2:22:49 PM    Final      Subjective: No complaints  Discharge Exam: Vitals:   02/27/21 0803 02/27/21 0805  BP: (!) 163/102 (!) 165/96  Pulse: 64 63  Resp: 18   Temp: 98.1 F (36.7 C)   SpO2: 96%    Vitals:   02/27/21 0446 02/27/21 0451 02/27/21 0803 02/27/21 0805  BP:  (!) 155/93 (!) 163/102 (!) 165/96  Pulse:  (!) 51 64 63  Resp:  16 18   Temp:  97.9 F (36.6 C) 98.1 F (36.7 C)   TempSrc:  Oral Oral   SpO2:  98% 96%   Weight: 91 kg       General: Pt is alert, awake, not in acute distress Cardiovascular: RRR, S1/S2 +, no rubs, no gallops Respiratory: CTA bilaterally, no wheezing, no rhonchi Abdominal: Soft, NT, ND, bowel sounds + Extremities: no edema, no cyanosis    The results of significant diagnostics from this hospitalization (including imaging, microbiology, ancillary and laboratory) are listed below for reference.     Microbiology: Recent Results (from the past 240 hour(s))  SARS CORONAVIRUS 2 (TAT 6-24 HRS) Nasopharyngeal Nasopharyngeal Swab     Status: None   Collection Time: 02/25/21  8:00 PM   Specimen: Nasopharyngeal Swab  Result Value Ref Range Status   SARS Coronavirus 2 NEGATIVE NEGATIVE Final    Comment: (NOTE) SARS-CoV-2 target nucleic acids are NOT DETECTED.  The SARS-CoV-2 RNA is generally detectable in upper and lower respiratory specimens during the acute phase of infection. Negative results do not preclude SARS-CoV-2  infection, do not rule out co-infections with other pathogens, and should not be used as the sole basis for treatment or other patient management decisions. Negative results must be combined with clinical observations, patient history, and epidemiological information. The expected result is Negative.  Fact Sheet for Patients: HairSlick.nohttps://www.fda.gov/media/138098/download  Fact Sheet for Healthcare Providers: quierodirigir.comhttps://www.fda.gov/media/138095/download  This test is not yet approved or cleared by the [place] FDA and  has been authorized for detection and/or diagnosis of SARS-CoV-2 by FDA under an Emergency Use Authorization (EUA). This EUA will remain  in effect (meaning this test can be used) for the duration of the COVID-19 declaration under Se ction 564(b)(1) of the Act, 21 U.S.C. section 360bbb-3(b)(1), unless the authorization  is terminated or revoked sooner.  Performed at [facility], [address], [place], [place] [address]      Labs: BNP (last 3 results) No results for input(s): BNP in the last 8760 hours. Basic Metabolic Panel: Recent Labs  Lab 02/25/21 1625 02/26/21 0443  NA 137 138  K 4.6 4.1  CL 104 105  CO2 19* 27  GLUCOSE 175* 120*  BUN 5* 7  CREATININE 1.47* 0.98  CALCIUM 8.6* 8.8*   Liver Function Tests: Recent Labs  Lab 02/25/21 1625 02/26/21 0443  AST 47* 26  ALT 31 20  ALKPHOS 42 33*  BILITOT 1.2 0.7  PROT 6.3* 6.1*  ALBUMIN 3.9 3.6   No results for input(s): LIPASE, AMYLASE in the last 168 hours. Recent Labs  Lab 02/25/21 1625  AMMONIA 85*   CBC: Recent Labs  Lab 02/25/21 1625 02/26/21 0443  WBC 16.6* 8.2  NEUTROABS 12.5*  --   HGB 15.1 13.5  HCT 47.7 41.8  MCV 99.4 96.3  PLT 274 189   Cardiac Enzymes: No results for input(s): CKTOTAL, CKMB, CKMBINDEX, TROPONINI in the last 168 hours. BNP: Invalid input(s): POCBNP CBG: Recent Labs  Lab 02/25/21 1647 02/27/21 0444  GLUCAP 130* 90   D-Dimer No results for  input(s): DDIMER in the last 72 hours. Hgb A1c No results for input(s): HGBA1C in the last 72 hours. Lipid Profile No results for input(s): CHOL, HDL, LDLCALC, TRIG, CHOLHDL, LDLDIRECT in the last 72 hours. Thyroid function studies No results for input(s): TSH, T4TOTAL, T3FREE, THYROIDAB in the last 72 hours.  Invalid input(s): FREET3 Anemia work up No results for input(s): VITAMINB12, FOLATE, FERRITIN, TIBC, IRON, RETICCTPCT in the last 72 hours. Urinalysis    Component Value Date/Time   COLORURINE YELLOW 02/25/2021 2111   APPEARANCEUR HAZY (A) 02/25/2021 2111   LABSPEC 1.012 02/25/2021 2111   PHURINE 5.0 02/25/2021 2111   GLUCOSEU 50 (A) 02/25/2021 2111   HGBUR SMALL (A) 02/25/2021 2111   BILIRUBINUR NEGATIVE 02/25/2021 2111   KETONESUR 5 (A) 02/25/2021 2111   PROTEINUR 30 (A) 02/25/2021 2111   NITRITE NEGATIVE 02/25/2021 2111   LEUKOCYTESUR NEGATIVE 02/25/2021 2111   Sepsis Labs Invalid input(s): PROCALCITONIN,  WBC,  LACTICIDVEN Microbiology Recent Results (from the past 240 hour(s))  SARS CORONAVIRUS 2 (TAT 6-24 HRS) Nasopharyngeal Nasopharyngeal Swab     Status: None   Collection Time: 02/25/21  8:00 PM   Specimen: Nasopharyngeal Swab  Result Value Ref Range Status   SARS Coronavirus 2 NEGATIVE NEGATIVE Final    Comment: (NOTE) SARS-CoV-2 target nucleic acids are NOT DETECTED.  The SARS-CoV-2 RNA is generally detectable in upper and lower respiratory specimens during the acute phase of infection. Negative results do not preclude SARS-CoV-2 infection, do not rule out co-infections with other pathogens, and should not be used as the sole basis for treatment or other patient management decisions. Negative results must be combined with clinical observations, patient history, and epidemiological information. The expected result is Negative.  Fact Sheet for Patients: HairSlick.no  Fact Sheet for Healthcare  Providers: quierodirigir.com  This test is not yet approved or cleared by the [place] FDA and  has been authorized for detection and/or diagnosis of SARS-CoV-2 by FDA under an Emergency Use Authorization (EUA). This EUA will remain  in effect (meaning this test can be used) for the duration of the COVID-19 declaration under Se ction 564(b)(1) of the Act, 21 U.S.C. section 360bbb-3(b)(1), unless the authorization is terminated or revoked sooner.  Performed at [facility], [address] [place], [place] [address]      Time coordinating discharge: Over 30 minutes  SIGNED:   Marinda Elk, MD  [facility] 02/27/2021, 8:29 AM Pager   If 7PM-7AM, please contact night-coverage [email or web address] Password [email or web address]

## 2021-02-27 NOTE — Plan of Care (Signed)

## 2021-02-27 NOTE — Progress Notes (Signed)
DISCHARGE NOTE HOME Jahan KENDALE REMBOLD to be discharged Home per MD order. Discussed prescriptions and follow up appointments with the patient. Prescriptions given to patient; medication list explained in detail. Patient verbalized understanding.  Skin clean, dry and intact without evidence of skin break down, no evidence of skin tears noted. IV catheter discontinued intact. Site without signs and symptoms of complications. Dressing and pressure applied. Pt denies pain at the site currently. No complaints noted.  Patient free of lines, drains, and wounds.   An After Visit Summary (AVS) was printed and given to the patient. Patient escorted via wheelchair, and discharged home via public auto taxi.  Katrina Stack, RN

## 2021-02-27 NOTE — Plan of Care (Signed)
  Problem: Education: Goal: Knowledge of General Education information will improve Description: Including pain rating scale, medication(s)/side effects and non-pharmacologic comfort measures 02/27/2021 1044 by Katrina Stack, RN Outcome: Adequate for Discharge 02/27/2021 0717 by Katrina Stack, RN Outcome: Progressing   Problem: Health Behavior/Discharge Planning: Goal: Ability to manage health-related needs will improve Outcome: Adequate for Discharge   Problem: Clinical Measurements: Goal: Ability to maintain clinical measurements within normal limits will improve Outcome: Adequate for Discharge Goal: Will remain free from infection Outcome: Adequate for Discharge Goal: Diagnostic test results will improve Outcome: Adequate for Discharge Goal: Respiratory complications will improve Outcome: Adequate for Discharge Goal: Cardiovascular complication will be avoided Outcome: Adequate for Discharge   Problem: Activity: Goal: Risk for activity intolerance will decrease 02/27/2021 1044 by Katrina Stack, RN Outcome: Adequate for Discharge 02/27/2021 7616 by Katrina Stack, RN Outcome: Progressing   Problem: Nutrition: Goal: Adequate nutrition will be maintained Outcome: Adequate for Discharge   Problem: Coping: Goal: Level of anxiety will decrease 02/27/2021 1044 by Katrina Stack, RN Outcome: Adequate for Discharge 02/27/2021 0737 by Katrina Stack, RN Outcome: Progressing   Problem: Elimination: Goal: Will not experience complications related to bowel motility Outcome: Adequate for Discharge   Problem: Pain Managment: Goal: General experience of comfort will improve Outcome: Adequate for Discharge   Problem: Safety: Goal: Ability to remain free from injury will improve 02/27/2021 1044 by Katrina Stack, RN Outcome: Adequate for Discharge 02/27/2021 0717 by Katrina Stack, RN Outcome: Progressing   Problem: Skin Integrity: Goal: Risk for impaired skin  integrity will decrease Outcome: Adequate for Discharge

## 2021-02-27 NOTE — Clinical Social Work Note (Signed)
Patient medically stable for discharge today. Mr. Bou transported home via [employer] taxi and cab voucher provided.   Genelle Bal, MSW, LCSW Licensed Clinical Social Worker Clinical Social Work Department [employer] [phone]
